# Patient Record
Sex: Male | Born: 1975 | Race: White | Hispanic: No | Marital: Married | Smoking: Never smoker
Health system: Southern US, Community
[De-identification: ages and names within clinical notes are randomized; demographics above are authoritative.]

## PROBLEM LIST (undated history)

## (undated) DIAGNOSIS — K921 Melena: Secondary | ICD-10-CM

## (undated) DIAGNOSIS — Z973 Presence of spectacles and contact lenses: Secondary | ICD-10-CM

## (undated) DIAGNOSIS — K219 Gastro-esophageal reflux disease without esophagitis: Secondary | ICD-10-CM

## (undated) DIAGNOSIS — F419 Anxiety disorder, unspecified: Secondary | ICD-10-CM

## (undated) DIAGNOSIS — F32A Depression, unspecified: Secondary | ICD-10-CM

## (undated) HISTORY — DX: Depression, unspecified: F32.A

## (undated) HISTORY — PX: SHOULDER ARTHROSCOPY: SHX128

## (undated) HISTORY — DX: Melena: K92.1

## (undated) HISTORY — DX: Anxiety disorder, unspecified: F41.9

## (undated) HISTORY — PX: ROTATOR CUFF REPAIR: SHX139

---

## 2020-03-02 DIAGNOSIS — K219 Gastro-esophageal reflux disease without esophagitis: Secondary | ICD-10-CM | POA: Insufficient documentation

## 2021-05-26 DIAGNOSIS — N281 Cyst of kidney, acquired: Secondary | ICD-10-CM | POA: Diagnosis not present

## 2021-05-26 DIAGNOSIS — N200 Calculus of kidney: Secondary | ICD-10-CM | POA: Diagnosis not present

## 2021-06-09 DIAGNOSIS — N281 Cyst of kidney, acquired: Secondary | ICD-10-CM | POA: Diagnosis not present

## 2021-06-09 DIAGNOSIS — N200 Calculus of kidney: Secondary | ICD-10-CM | POA: Diagnosis not present

## 2021-06-12 ENCOUNTER — Other Ambulatory Visit: Payer: Self-pay

## 2021-06-13 ENCOUNTER — Other Ambulatory Visit: Payer: Self-pay

## 2021-06-13 ENCOUNTER — Encounter: Payer: Self-pay | Admitting: Gastroenterology

## 2021-06-13 ENCOUNTER — Ambulatory Visit: Payer: BC Managed Care – PPO | Admitting: Gastroenterology

## 2021-06-13 VITALS — BP 138/81 | HR 72 | Temp 98.4°F | Ht 74.0 in | Wt 242.4 lb

## 2021-06-13 DIAGNOSIS — K224 Dyskinesia of esophagus: Secondary | ICD-10-CM

## 2021-06-13 NOTE — Progress Notes (Signed)
?  ?Shawn Repress, MD ?[address]  ?[address]  ?[place], [place] [address]  ?Main: [phone]  ?Fax: [phone] ? ? ? ?Gastroenterology Consultation ? ?Referring Provider:     No ref. provider found ?Primary Care Physician:  Pcp, No ?Primary Gastroenterologist:  Dr. Arlyss Melendez ?Reason for Consultation:     Esophageal spasm/atypical chest pain ?      ? HPI:   ?Shawn Melendez is a 46 y.o. male referred by Pcp, No  for consultation & management of esophageal spasm.  Patient reports that for the last 1 year, he has been experiencing episodes of what he felt like an esophageal spasm.  He was originally evaluated for any cardiac etiology and it was unremarkable.  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  He was started on omeprazole 40 mg once daily which has been taking.  He did notice that his symptoms are less frequent and milder.  He used to experience food getting stuck especially dry [place] this was in the past.  He denies any food allergies or seasonal allergies ? 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  He moved from [place].  He lives with his wife and 4 kids ? ?NSAIDs: None ? ?Antiplts/Anticoagulants/Anti thrombotics: None ? ?GI Procedures: None ? ?History reviewed. No pertinent past medical history. ? ?History reviewed. No pertinent surgical history. ? ? ?Current Outpatient Medications:  ?  Omeprazole 20 MG TBDD, 40 mg., Disp: , Rfl:  ? ?History reviewed. No pertinent family history.  ? ?Social History  ? ?Tobacco Use  ? Smoking status: Never  ? Smokeless tobacco: Never  ?Substance Use Topics  ? Alcohol use: Yes  ?  Comment: 3-4 times a week  ? Drug use: Never  ? ? ?Allergies as of 06/13/2021  ? (No Known Allergies)  ? ? ?Review of Systems:    ?All systems reviewed and negative except where noted in HPI. ? ? Physical Exam:  ?BP 138/81 (BP  Location: Left Arm, Patient Position: Sitting, Cuff Size: Normal)   Pulse 72   Temp 98.4 ?F (36.9 ?C) (Oral)   Ht 6\' 2"  (1.88 m)   Wt 242 lb 6 oz (109.9 kg)   BMI 31.12 kg/m?  ?No LMP for male patient. ? ?General:   Alert,  Well-developed, well-nourished, pleasant and cooperative in NAD ?Head:  Normocephalic and atraumatic. ?Eyes:  Sclera clear, no icterus.   Conjunctiva pink. ?Ears:  Normal auditory acuity. ?Nose:  No deformity, discharge, or lesions. ?Mouth:  No deformity or lesions,oropharynx pink & moist. ?Neck:  Supple; no masses or thyromegaly. ?Lungs:  Respirations even and unlabored.  Clear throughout to auscultation.   No wheezes, crackles, or rhonchi. No acute distress. ?Heart:  Regular rate and rhythm; no murmurs, clicks, rubs, or gallops. ?Abdomen:  Normal bowel sounds. Soft, non-tender and non-distended without masses, hepatosplenomegaly or hernias noted.  No guarding or rebound tenderness.   ?Rectal: Not performed ?Msk:  Symmetrical without gross deformities. Good, equal movement & strength bilaterally. ?Pulses:  Normal pulses noted. ?Extremities:  No clubbing or edema.  No cyanosis. ?Neurologic:  Alert and oriented x3;  grossly normal neurologically. ?Skin:  Intact without significant lesions or rashes. No jaundice. ?Psych:  Alert and cooperative. Normal mood and affect. ? ?Imaging Studies: ?Reviewed ? ?Assessment and Plan:  ? ?Shawn Melendez is a 46 y.o.  pleasant Caucasian male with no significant past medical history seen in consultation for 1 year history of atypical chest pain.  Cardiac etiology is ruled out.  Barium esophagogram revealed cricopharyngeal prominence only.  Currently on omeprazole 40 mg daily ? ?Recommend EGD with proximal and distal esophageal biopsies to evaluate for EOE ?Continue omeprazole for now ? ?Follow up based on the EGD results ? ? ?Shawn Repress, MD ? ?

## 2021-06-15 ENCOUNTER — Encounter: Payer: Self-pay | Admitting: Gastroenterology

## 2021-06-19 DIAGNOSIS — R202 Paresthesia of skin: Secondary | ICD-10-CM | POA: Insufficient documentation

## 2021-06-27 ENCOUNTER — Other Ambulatory Visit: Payer: Self-pay

## 2021-06-27 ENCOUNTER — Encounter
Admission: RE | Disposition: A | Discharge status: Home / Self Care | Payer: Self-pay | Source: Home / Self Care | Attending: Gastroenterology

## 2021-06-27 ENCOUNTER — Ambulatory Visit
Admission: RE | Admit: 2021-06-27 | Discharge: 2021-06-27 | Disposition: A | Discharge status: Home / Self Care | Payer: BC Managed Care – PPO | Attending: Gastroenterology | Admitting: Gastroenterology

## 2021-06-27 ENCOUNTER — Ambulatory Visit: Payer: BC Managed Care – PPO | Admitting: Anesthesiology

## 2021-06-27 DIAGNOSIS — K3189 Other diseases of stomach and duodenum: Secondary | ICD-10-CM | POA: Diagnosis not present

## 2021-06-27 DIAGNOSIS — K219 Gastro-esophageal reflux disease without esophagitis: Secondary | ICD-10-CM | POA: Insufficient documentation

## 2021-06-27 DIAGNOSIS — K319 Disease of stomach and duodenum, unspecified: Secondary | ICD-10-CM | POA: Diagnosis not present

## 2021-06-27 DIAGNOSIS — K224 Dyskinesia of esophagus: Secondary | ICD-10-CM | POA: Diagnosis not present

## 2021-06-27 DIAGNOSIS — R0789 Other chest pain: Secondary | ICD-10-CM | POA: Diagnosis not present

## 2021-06-27 HISTORY — DX: Presence of spectacles and contact lenses: Z97.3

## 2021-06-27 HISTORY — DX: Gastro-esophageal reflux disease without esophagitis: K21.9

## 2021-06-27 HISTORY — PX: ESOPHAGOGASTRODUODENOSCOPY (EGD) WITH PROPOFOL: SHX5813

## 2021-06-27 SURGERY — ESOPHAGOGASTRODUODENOSCOPY (EGD) WITH PROPOFOL
Anesthesia: Monitor Anesthesia Care | Site: Esophagus

## 2021-06-27 MED ORDER — ACETAMINOPHEN 160 MG/5ML PO SOLN: 325.0000 mg | ORAL | Status: DC | PRN

## 2021-06-27 MED ORDER — SODIUM CHLORIDE 0.9 % IV SOLN: INTRAVENOUS | Status: DC

## 2021-06-27 MED ORDER — LIDOCAINE HCL (CARDIAC) PF 100 MG/5ML IV SOSY
PREFILLED_SYRINGE | INTRAVENOUS | Status: DC | PRN
  Administered 2021-06-27: 100 mg via INTRAVENOUS

## 2021-06-27 MED ORDER — PROPOFOL 10 MG/ML IV BOLUS
INTRAVENOUS | Status: DC | PRN
  Administered 2021-06-27 (×2): 100 mg via INTRAVENOUS

## 2021-06-27 MED ORDER — LACTATED RINGERS IV SOLN: INTRAVENOUS | Status: DC

## 2021-06-27 MED ORDER — GLYCOPYRROLATE 0.2 MG/ML IJ SOLN
INTRAMUSCULAR | Status: DC | PRN
Start: 2021-06-27 — End: 2021-06-27
  Administered 2021-06-27: .2 mg via INTRAVENOUS

## 2021-06-27 MED ORDER — STERILE WATER FOR IRRIGATION IR SOLN
Status: DC | PRN
  Administered 2021-06-27: 1

## 2021-06-27 MED ORDER — ACETAMINOPHEN 325 MG PO TABS: 325.0000 mg | ORAL_TABLET | ORAL | Status: DC | PRN

## 2021-06-27 SURGICAL SUPPLY — 34 items
BALLN DILATOR 10-12 8 (BALLOONS)
BALLN DILATOR 12-15 8 (BALLOONS)
BALLN DILATOR 15-18 8 (BALLOONS)
BALLN DILATOR CRE 0-12 8 (BALLOONS)
BALLN DILATOR ESOPH 8 10 CRE (MISCELLANEOUS) IMPLANT
BALLOON DILATOR 12-15 8 (BALLOONS) IMPLANT
BALLOON DILATOR 15-18 8 (BALLOONS) IMPLANT
BALLOON DILATOR CRE 0-12 8 (BALLOONS) IMPLANT
BLOCK BITE 60FR ADLT L/F GRN (MISCELLANEOUS) ×2 IMPLANT
CLIP HMST 235XBRD CATH ROT (MISCELLANEOUS) IMPLANT
CLIP RESOLUTION 360 11X235 (MISCELLANEOUS)
ELECT REM PT RETURN 9FT ADLT (ELECTROSURGICAL)
ELECTRODE REM PT RTRN 9FT ADLT (ELECTROSURGICAL) IMPLANT
FCP ESCP3.2XJMB 240X2.8X (MISCELLANEOUS) ×1
FORCEPS BIOP RAD 4 LRG CAP 4 (CUTTING FORCEPS) IMPLANT
FORCEPS BIOP RJ4 240 W/NDL (MISCELLANEOUS) ×2
FORCEPS ESCP3.2XJMB 240X2.8X (MISCELLANEOUS) IMPLANT
GOWN CVR UNV OPN BCK APRN NK (MISCELLANEOUS) ×2 IMPLANT
GOWN ISOL THUMB LOOP REG UNIV (MISCELLANEOUS) ×4
INJECTOR VARIJECT VIN23 (MISCELLANEOUS) IMPLANT
KIT DEFENDO VALVE AND CONN (KITS) IMPLANT
KIT PRC NS LF DISP ENDO (KITS) ×1 IMPLANT
KIT PROCEDURE OLYMPUS (KITS) ×2
MANIFOLD NEPTUNE II (INSTRUMENTS) ×2 IMPLANT
MARKER SPOT ENDO TATTOO 5ML (MISCELLANEOUS) IMPLANT
RETRIEVER NET PLAT FOOD (MISCELLANEOUS) IMPLANT
SNARE SHORT THROW 13M SML OVAL (MISCELLANEOUS) IMPLANT
SNARE SHORT THROW 30M LRG OVAL (MISCELLANEOUS) IMPLANT
SPOT EX ENDOSCOPIC TATTOO (MISCELLANEOUS)
SYR INFLATION 60ML (SYRINGE) IMPLANT
TRAP ETRAP POLY (MISCELLANEOUS) IMPLANT
VARIJECT INJECTOR VIN23 (MISCELLANEOUS)
WATER STERILE IRR 250ML POUR (IV SOLUTION) ×2 IMPLANT
WIRE CRE 18-20MM 8CM F G (MISCELLANEOUS) IMPLANT

## 2021-06-27 NOTE — Transfer of Care (Signed)
Immediate Anesthesia Transfer of Care Note ? ?Patient: Shawn Melendez ? ?Procedure(s) Performed: ESOPHAGOGASTRODUODENOSCOPY (EGD) WITH PROPOFOL (Esophagus) ? ?Patient Location: PACU ? ?Anesthesia Type: MAC ? ?Level of Consciousness: awake, alert  and patient cooperative ? ?Airway and Oxygen Therapy: Patient Spontanous Breathing and Patient connected to supplemental oxygen ? ?Post-op Assessment: Post-op Vital signs reviewed, Patient's Cardiovascular Status Stable, Respiratory Function Stable, Patent Airway and No signs of Nausea or vomiting ? ?Post-op Vital Signs: Reviewed and stable ? ?Complications: No notable events documented. ? ?

## 2021-06-27 NOTE — Anesthesia Preprocedure Evaluation (Addendum)
Anesthesia Evaluation  ?Patient identified by MRN, date of birth, ID band ?Patient awake ? ? ? ?Reviewed: ?Allergy & Precautions, H&P , NPO status , Patient's Chart, lab work & pertinent test results, reviewed documented beta blocker date and time  ? ?Airway ?Mallampati: II ? ?TM Distance: >3 FB ?Neck ROM: full ? ? ? Dental ?no notable dental hx. ? ?  ?Pulmonary ?neg pulmonary ROS,  ?  ?Pulmonary exam normal ?breath sounds clear to auscultation ? ? ? ? ? ? Cardiovascular ?Exercise Tolerance: Good ?negative cardio ROS ?Normal cardiovascular exam ?Rhythm:regular Rate:Normal ? ? ?  ?Neuro/Psych ?negative neurological ROS ? negative psych ROS  ? GI/Hepatic ?Neg liver ROS, GERD  ,  ?Endo/Other  ?negative endocrine ROS ? Renal/GU ?negative Renal ROS  ?negative genitourinary ?  ?Musculoskeletal ? ? Abdominal ?  ?Peds ? Hematology ?negative hematology ROS ?(+)   ?Anesthesia Other Findings ? ? Reproductive/Obstetrics ?negative OB ROS ? ?  ? ? ? ? ? ? ? ? ? ? ? ? ? ?  ?  ? ? ? ? ? ? ? ? ?Anesthesia Physical ?Anesthesia Plan ? ?ASA: 2 ? ?Anesthesia Plan: General  ? ?Post-op Pain Management:   ? ?Induction:  ? ?PONV Risk Score and Plan:  ? ?Airway Management Planned:  ? ?Additional Equipment:  ? ?Intra-op Plan:  ? ?Post-operative Plan:  ? ?Informed Consent: I have reviewed the patients History and Physical, chart, labs and discussed the procedure including the risks, benefits and alternatives for the proposed anesthesia with the patient or authorized representative who has indicated his/her understanding and acceptance.  ? ? ? ?Dental Advisory Given ? ?Plan Discussed with: CRNA and Anesthesiologist ? ?Anesthesia Plan Comments:   ? ? ? ? ? ?Anesthesia Quick Evaluation ? ?

## 2021-06-27 NOTE — Anesthesia Postprocedure Evaluation (Signed)
Anesthesia Post Note ? ?Patient: Shawn Melendez ? ?Procedure(s) Performed: ESOPHAGOGASTRODUODENOSCOPY (EGD) WITH PROPOFOL (Esophagus) ? ? ?  ?Patient location during evaluation: PACU ?Anesthesia Type: General ?Level of consciousness: awake and alert ?Pain management: pain level controlled ?Vital Signs Assessment: post-procedure vital signs reviewed and stable ?Respiratory status: spontaneous breathing, nonlabored ventilation, respiratory function stable and patient connected to nasal cannula oxygen ?Cardiovascular status: blood pressure returned to baseline and stable ?Postop Assessment: no apparent nausea or vomiting ?Anesthetic complications: no ? ? ?No notable events documented. ? ?Trecia Rogers ? ? ? ? ? ?

## 2021-06-27 NOTE — Op Note (Signed)
[facility] ?Gastroenterology ?Patient Name: Shawn Melendez ?Procedure Date: 06/27/2021 8:28 AM ?MRN: [id number] ?Account #: [id number] ?Date of Birth: 10/07/1975 ?Admit Type: Outpatient ?Age: 46 ?Room: [facility] OR ROOM 01 ?Gender: Male ?Note Status: Finalized ?Instrument Name: [id number] ?Procedure:             Upper GI endoscopy ?Indications:           Chest pain (non cardiac) ?Providers:             Lin Landsman MD, MD ?Medicines:             General Anesthesia ?Complications:         No immediate complications. Estimated blood loss: None. ?Procedure:             Pre-Anesthesia Assessment: ?                       - Prior to the procedure, a History and Physical was  ?                       performed, and patient medications and allergies were  ?                       reviewed. The patient is competent. The risks and  ?                       benefits of the procedure and the sedation options and  ?                       risks were discussed with the patient. All questions  ?                       were answered and informed consent was obtained.  ?                       Patient identification and proposed procedure were  ?                       verified by the physician, the nurse, the  ?                       anesthesiologist, the anesthetist and the technician  ?                       in the pre-procedure area in the procedure room in the  ?                       endoscopy suite. Mental Status Examination: alert and  ?                       oriented. Airway Examination: normal oropharyngeal  ?                       airway and neck mobility. Respiratory Examination:  ?                       clear to auscultation. CV Examination: normal.  ?                       Prophylactic Antibiotics: The  patient does not require  ?                       prophylactic antibiotics. Prior Anticoagulants: The  ?                       patient has taken no previous anticoagulant or  ?                       antiplatelet  agents. ASA Grade Assessment: II - A  ?                       patient with mild systemic disease. After reviewing  ?                       the risks and benefits, the patient was deemed in  ?                       satisfactory condition to undergo the procedure. The  ?                       anesthesia plan was to use general anesthesia.  ?                       Immediately prior to administration of medications,  ?                       the patient was re-assessed for adequacy to receive  ?                       sedatives. The heart rate, respiratory rate, oxygen  ?                       saturations, blood pressure, adequacy of pulmonary  ?                       ventilation, and response to care were monitored  ?                       throughout the procedure. The physical status of the  ?                       patient was re-assessed after the procedure. ?                       After obtaining informed consent, the endoscope was  ?                       passed under direct vision. Throughout the procedure,  ?                       the patient's blood pressure, pulse, and oxygen  ?                       saturations were monitored continuously. The was  ?                       introduced through the mouth, and advanced to the  ?  second part of duodenum. The upper GI endoscopy was  ?                       accomplished without difficulty. The patient tolerated  ?                       the procedure well. ?Findings: ?     The duodenal bulb and second portion of the duodenum were normal. ?     Diffuse mildly erythematous mucosa without bleeding was found in the  ?     gastric body and in the gastric antrum. Biopsies were taken with a cold  ?     forceps for histology. ?     Esophagogastric landmarks were identified: the gastroesophageal junction  ?     was found at 43 cm from the incisors. ?     The gastroesophageal junction and examined esophagus were normal.  ?     Biopsies were obtained from the  proximal and distal esophagus with cold  ?     forceps for histology of suspected eosinophilic esophagitis. ?Impression:            - Normal duodenal bulb and second portion of the  ?                       duodenum. ?                       - Erythematous mucosa in the gastric body and antrum.  ?                       Biopsied. ?                       - Esophagogastric landmarks identified. ?                       - Normal gastroesophageal junction and esophagus.  ?                       Biopsied. ?Recommendation:        - Discharge patient to home (with escort). ?                       - Resume previous diet today. ?                       - Continue present medications. ?                       - Await pathology results. ?Procedure Code(s):     --- Professional --- ?                       [phone], Esophagogastroduodenoscopy, flexible,  ?                       transoral; with biopsy, single or multiple ?Diagnosis Code(s):     --- Professional --- ?                       K31.89, Other diseases of stomach and duodenum ?  R07.89, Other chest pain ?CPT copyright 2019 American Medical Association. All rights reserved. ?The codes documented in this report are preliminary and upon coder review may  ?be revised to meet current compliance requirements. ?Dr. Ulyess Mort ?Maresa Morash Raeanne Gathers MD, MD ?06/27/2021 8:43:46 AM ?This report has been signed electronically. ?Number of Addenda: 0 ?Note Initiated On: 06/27/2021 8:28 AM ?Total Procedure Duration: 0 hours 6 minutes 55 seconds  ?Estimated Blood Loss:  Estimated blood loss: none. ?     [facility] ?

## 2021-06-27 NOTE — H&P (Signed)
?  Arlyss Repress, MD ?[address]  ?[address]  ?[place], [place] [address]  ?Main: [phone]  ?Fax: [phone] ?Pager: [phone] ? ?Primary Care Physician:  Pcp, No ?Primary Gastroenterologist:  Dr. Arlyss Repress ? ?Pre-Procedure History & Physical: ?HPI:  Claire Bridge is a 46 y.o. male is here for an endoscopy. ?  ?Past Medical History:  ?Diagnosis Date  ? GERD (gastroesophageal reflux disease)   ? Wears contact lenses   ? ? ?Past Surgical History:  ?Procedure Laterality Date  ? SHOULDER ARTHROSCOPY Right   ? ? ?Prior to Admission medications   ?Medication Sig Start Date End Date Taking? Authorizing Provider  ?Omeprazole 20 MG TBDD 40 mg. 07/01/20   [provider]  ? ? ?Allergies as of 06/13/2021  ? (No Known Allergies)  ? ? ?History reviewed. No pertinent family history. ? ?Social History  ? ?Socioeconomic History  ? Marital status: Married  ?  Spouse name: Not on file  ? Number of children: Not on file  ? Years of education: Not on file  ? Highest education level: Not on file  ?Occupational History  ? Not on file  ?Tobacco Use  ? Smoking status: Never  ? Smokeless tobacco: Never  ?Substance and Sexual Activity  ? Alcohol use: Yes  ?  Comment: 3-4 times a week  ? Drug use: Never  ? Sexual activity: Not on file  ?Other Topics Concern  ? Not on file  ?Social History Narrative  ? Not on file  ? ?Social Determinants of Health  ? ?Financial Resource Strain: Not on file  ?Food Insecurity: Not on file  ?Transportation Needs: Not on file  ?Physical Activity: Not on file  ?Stress: Not on file  ?Social Connections: Not on file  ?Intimate Partner Violence: Not on file  ? ? ?Review of Systems: ?See HPI, otherwise negative ROS ? ?Physical Exam: ?BP 133/80   Pulse 67   Temp 98 ?F (36.7 ?C) (Temporal)   Resp 18   Ht 6\' 2"  (1.88 m)   Wt 108.9 kg   SpO2 98%   BMI 30.81 kg/m?  ?General:   Alert,  pleasant and cooperative in NAD ?Head:  Normocephalic and atraumatic. ?Neck:  Supple; no masses or  thyromegaly. ?Lungs:  Clear throughout to auscultation.    ?Heart:  Regular rate and rhythm. ?Abdomen:  Soft, nontender and nondistended. Normal bowel sounds, without guarding, and without rebound.   ?Neurologic:  Alert and  oriented x4;  grossly normal neurologically. ? ?Impression/Plan: ?Khyree Carillo is here for an endoscopy to be performed for esophageal spasm/atypical chest pain ? ?Risks, benefits, limitations, and alternatives regarding  endoscopy have been reviewed with the patient.  Questions have been answered.  All parties agreeable. ? ? ?Veto Kemps, MD  06/27/2021, 8:29 AM ?

## 2021-06-27 NOTE — Anesthesia Procedure Notes (Addendum)
Procedure Name: [place] ?Date/Time: 06/27/2021 8:35 AM ?Performed by: Jeannene Patella, CRNA ?Pre-anesthesia Checklist: Patient identified, Emergency Drugs available, Suction available, Timeout performed and Patient being monitored ?Patient Re-evaluated:Patient Re-evaluated prior to induction ?Oxygen Delivery Method: Nasal cannula ?Placement Confirmation: positive ETCO2 ? ? ? ? ?

## 2021-06-28 ENCOUNTER — Encounter: Payer: Self-pay | Admitting: Gastroenterology

## 2021-06-28 ENCOUNTER — Telehealth: Payer: Self-pay

## 2021-06-28 LAB — SURGICAL PATHOLOGY

## 2021-06-28 NOTE — Telephone Encounter (Signed)
-----   Message from Lin Landsman, MD sent at 06/28/2021 12:47 PM EDT ----- ?Caryl Pina ? ?Please inform patient that the pathology results from upper endoscopy came back all normal.  Next step would be to evaluate for any abnormality of the motility of his esophagus by doing a motility study called esophageal manometry.  If he agrees, please refer him to [facility] ? ?Rohini Vanga ?

## 2021-06-28 NOTE — Telephone Encounter (Signed)
Called and left a message for call back  

## 2021-06-29 NOTE — Telephone Encounter (Signed)
Patient verbalized of results he states he is going to research this test and if he decides to do it he will let us know   ?

## 2021-07-31 ENCOUNTER — Ambulatory Visit: Payer: BC Managed Care – PPO | Admitting: Registered Nurse

## 2021-07-31 ENCOUNTER — Encounter: Payer: Self-pay | Admitting: Registered Nurse

## 2021-07-31 ENCOUNTER — Encounter: Payer: Self-pay | Admitting: Neurology

## 2021-07-31 ENCOUNTER — Other Ambulatory Visit: Payer: Self-pay

## 2021-07-31 VITALS — BP 136/82 | HR 69 | Temp 98.1°F | Resp 18 | Ht 74.0 in | Wt 242.2 lb

## 2021-07-31 DIAGNOSIS — R200 Anesthesia of skin: Secondary | ICD-10-CM

## 2021-07-31 DIAGNOSIS — G1220 Motor neuron disease, unspecified: Secondary | ICD-10-CM

## 2021-07-31 DIAGNOSIS — R531 Weakness: Secondary | ICD-10-CM | POA: Diagnosis not present

## 2021-07-31 DIAGNOSIS — G629 Polyneuropathy, unspecified: Secondary | ICD-10-CM | POA: Diagnosis not present

## 2021-07-31 DIAGNOSIS — Z820 Family history of epilepsy and other diseases of the nervous system: Secondary | ICD-10-CM | POA: Diagnosis not present

## 2021-07-31 LAB — COMPREHENSIVE METABOLIC PANEL
ALT: 88 U/L — ABNORMAL HIGH (ref 0–53)
AST: 78 U/L — ABNORMAL HIGH (ref 0–37)
Albumin: 4.7 g/dL (ref 3.5–5.2)
Alkaline Phosphatase: 62 U/L (ref 39–117)
BUN: 14 mg/dL (ref 6–23)
CO2: 26 mEq/L (ref 19–32)
Calcium: 9.1 mg/dL (ref 8.4–10.5)
Chloride: 100 mEq/L (ref 96–112)
Creatinine, Ser: 1.06 mg/dL (ref 0.40–1.50)
GFR: 85.62 mL/min (ref >60.00)
Glucose, Bld: 87 mg/dL (ref 70–99)
Potassium: 4 mEq/L (ref 3.5–5.1)
Sodium: 136 mEq/L (ref 135–145)
Total Bilirubin: 0.9 mg/dL (ref 0.2–1.2)
Total Protein: 7.7 g/dL (ref 6.0–8.3)

## 2021-07-31 LAB — LIPID PANEL
Cholesterol: 230 mg/dL — ABNORMAL HIGH (ref 0–200)
HDL: 44.8 mg/dL (ref >39.00)
LDL Cholesterol: 166 mg/dL — ABNORMAL HIGH (ref 0–99)
NonHDL: 185.23
Total CHOL/HDL Ratio: 5
Triglycerides: 97 mg/dL (ref 0.0–149.0)
VLDL: 19.4 mg/dL (ref 0.0–40.0)

## 2021-07-31 LAB — URINALYSIS, ROUTINE W REFLEX MICROSCOPIC
Bilirubin Urine: NEGATIVE
Hgb urine dipstick: NEGATIVE
Ketones, ur: NEGATIVE
Leukocytes,Ua: NEGATIVE
Nitrite: NEGATIVE
RBC / HPF: NONE SEEN (ref 0–?)
Specific Gravity, Urine: 1.015 (ref 1.000–1.030)
Total Protein, Urine: NEGATIVE
Urine Glucose: NEGATIVE
Urobilinogen, UA: 0.2 (ref 0.0–1.0)
pH: 6.5 (ref 5.0–8.0)

## 2021-07-31 LAB — CBC WITH DIFFERENTIAL/PLATELET
Basophils Absolute: 0.1 10*3/uL (ref 0.0–0.1)
Basophils Relative: 0.8 % (ref 0.0–3.0)
Eosinophils Absolute: 0.2 10*3/uL (ref 0.0–0.7)
Eosinophils Relative: 2.4 % (ref 0.0–5.0)
HCT: 42.3 % (ref 39.0–52.0)
Hemoglobin: 14.3 g/dL (ref 13.0–17.0)
Lymphocytes Relative: 29.8 % (ref 12.0–46.0)
Lymphs Abs: 2 10*3/uL (ref 0.7–4.0)
MCHC: 33.8 g/dL (ref 30.0–36.0)
MCV: 93.6 fl (ref 78.0–100.0)
Monocytes Absolute: 0.6 10*3/uL (ref 0.1–1.0)
Monocytes Relative: 8.5 % (ref 3.0–12.0)
Neutro Abs: 3.9 10*3/uL (ref 1.4–7.7)
Neutrophils Relative %: 58.5 % (ref 43.0–77.0)
Platelets: 246 10*3/uL (ref 150.0–400.0)
RBC: 4.52 Mil/uL (ref 4.22–5.81)
RDW: 13.2 % (ref 11.5–15.5)
WBC: 6.7 10*3/uL (ref 4.0–10.5)

## 2021-07-31 LAB — B12 AND FOLATE PANEL
Folate: >23.5 ng/mL (ref >5.9)
Vitamin B-12: 292 pg/mL (ref 211–911)

## 2021-07-31 LAB — C-REACTIVE PROTEIN: CRP: <1 mg/dL (ref 0.5–20.0)

## 2021-07-31 LAB — VITAMIN D 25 HYDROXY (VIT D DEFICIENCY, FRACTURES): VITD: 29.48 ng/mL — ABNORMAL LOW (ref 30.00–100.00)

## 2021-07-31 LAB — HEMOGLOBIN A1C: Hgb A1c MFr Bld: 5.3 % (ref 4.6–6.5)

## 2021-07-31 LAB — SEDIMENTATION RATE: Sed Rate: 17 mm/hr — ABNORMAL HIGH (ref 0–15)

## 2021-07-31 LAB — TSH: TSH: 4.13 u[IU]/mL (ref 0.35–5.50)

## 2021-07-31 NOTE — Progress Notes (Signed)
? ?New Patient Office Visit ? ?Subjective:  ?Patient ID: Shawn Melendez, male    DOB: 17-Dec-1975  Age: 46 y.o. MRN: [id number] ? ?CC:  ?Chief Complaint  ?Patient presents with  ? New Patient (Initial Visit)  ?  Patient states he is here to establish care. Patient states he has been feeling numbness and tingling in face , lower back and leg for 5-6 weeks   ? ? ?HPI ?Shawn Melendez presents to establish care ? ?Numbness: ?Face, lower back, leg. Superficial numbness.  ?Ongoing 5-6 weeks ?Has had this in the past, but never for this duration. ?Occ trouble focusing vision, but generally 20/20 with corrective lenses. ?Occ "zaps" in legs. Some weakness in legs - feeling heavy.  ? ?Admits he is not as active as he had been in the past.  ? ?Outpatient Encounter Medications as of 07/31/2021  ?Medication Sig  ? Omeprazole 20 MG TBDD 40 mg.  ? ?No facility-administered encounter medications on file as of 07/31/2021.  ? ? ?Past Medical History:  ?Diagnosis Date  ? Anxiety   ? Depression   ? GERD (gastroesophageal reflux disease)   ? Wears contact lenses   ? ? ?Past Surgical History:  ?Procedure Laterality Date  ? ESOPHAGOGASTRODUODENOSCOPY (EGD) WITH PROPOFOL N/A 06/27/2021  ? Procedure: ESOPHAGOGASTRODUODENOSCOPY (EGD) WITH PROPOFOL;  Surgeon: Toney Reil, MD;  [facility];  Service: Endoscopy;  Laterality: N/A;  ? SHOULDER ARTHROSCOPY Right   ? ? ?Family History  ?Problem Relation Age of Onset  ? Hypertension Mother   ? Diabetes Father   ? Hypertension Father   ? Stroke Father   ? Multiple sclerosis Father   ? Diabetes Brother   ? ? ?Social History  ? ?Socioeconomic History  ? Marital status: Married  ?  Spouse name: Not on file  ? Number of children: 4  ? Years of education: Not on file  ? Highest education level: Not on file  ?Occupational History  ? Not on file  ?Tobacco Use  ? Smoking status: Never  ? Smokeless tobacco: Never  ?Vaping Use  ? Vaping Use: Never used  ?Substance and Sexual Activity  ?  Alcohol use: Yes  ?  Alcohol/week: 10.0 standard drinks  ?  Types: 10 Shots of liquor per week  ?  Comment: 3-4 times a week  ? Drug use: Never  ? Sexual activity: Not Currently  ?Other Topics Concern  ? Not on file  ?Social History Narrative  ? Not on file  ? ?Social Determinants of Health  ? ?Financial Resource Strain: Not on file  ?Food Insecurity: Not on file  ?Transportation Needs: Not on file  ?Physical Activity: Not on file  ?Stress: Not on file  ?Social Connections: Not on file  ?Intimate Partner Violence: Not on file  ? ? ?ROS ?Review of Systems  ?Constitutional: Negative.   ?HENT: Negative.    ?Eyes: Negative.   ?Respiratory: Negative.    ?Cardiovascular: Negative.   ?Gastrointestinal: Negative.   ?Genitourinary: Negative.   ?Musculoskeletal: Negative.   ?Skin: Negative.   ?Neurological: Negative.   ?Psychiatric/Behavioral: Negative.    ?All other systems reviewed and are negative. ? ?Objective:  ? ?Today's Vitals: BP 136/82   Pulse 69   Temp 98.1 ?F (36.7 ?C) (Temporal)   Resp 18   Ht 6\' 2"  (1.88 m)   Wt 242 lb 3.2 oz (109.9 kg)   SpO2 98%   BMI 31.10 kg/m?  ? ?Physical Exam ?Constitutional:   ?   General:  He is not in acute distress. ?   Appearance: Normal appearance. He is normal weight. He is not ill-appearing, toxic-appearing or diaphoretic.  ?Cardiovascular:  ?   Rate and Rhythm: Normal rate and regular rhythm.  ?   Heart sounds: Normal heart sounds. No murmur heard. ?  No friction rub. No gallop.  ?Pulmonary:  ?   Effort: Pulmonary effort is normal. No respiratory distress.  ?   Breath sounds: Normal breath sounds. No stridor. No wheezing, rhonchi or rales.  ?Chest:  ?   Chest wall: No tenderness.  ?Skin: ?   General: Skin is warm and dry.  ?Neurological:  ?   General: No focal deficit present.  ?   Mental Status: He is alert and oriented to person, place, and time. Mental status is at baseline.  ?   Cranial Nerves: No cranial nerve deficit.  ?   Sensory: No sensory deficit.  ?   Motor: No  weakness.  ?   Coordination: Coordination normal.  ?   Gait: Gait normal.  ?Psychiatric:     ?   Mood and Affect: Mood normal.     ?   Behavior: Behavior normal.     ?   Thought Content: Thought content normal.     ?   Judgment: Judgment normal.  ? ? ? ? ? ? ?Assessment & Plan:  ? ?Problem List Items Addressed This Visit   ?None ?Visit Diagnoses   ? ? Neuropathy    -  Primary  ? Relevant Orders  ? Comprehensive metabolic panel  ? Hemoglobin A1c  ? CBC with Differential/Platelet  ? Lipid panel  ? TSH  ? B12 and Folate Panel  ? Vitamin D (25 hydroxy)  ? Antinuclear Antib (ANA)  ? Sedimentation rate  ? C-reactive protein  ? Urinalysis, Routine w reflex microscopic  ? Ambulatory referral to Neurology  ? CT HEAD WO CONTRAST ( )  ? Weakness      ? Relevant Orders  ? Comprehensive metabolic panel  ? Hemoglobin A1c  ? CBC with Differential/Platelet  ? Lipid panel  ? TSH  ? B12 and Folate Panel  ? Vitamin D (25 hydroxy)  ? Antinuclear Antib (ANA)  ? Sedimentation rate  ? C-reactive protein  ? Urinalysis, Routine w reflex microscopic  ? Ambulatory referral to Neurology  ? CT HEAD WO CONTRAST ( )  ? Facial numbness      ? Relevant Orders  ? Comprehensive metabolic panel  ? Hemoglobin A1c  ? CBC with Differential/Platelet  ? Lipid panel  ? TSH  ? B12 and Folate Panel  ? Vitamin D (25 hydroxy)  ? Antinuclear Antib (ANA)  ? Sedimentation rate  ? C-reactive protein  ? Urinalysis, Routine w reflex microscopic  ? Ambulatory referral to Neurology  ? CT HEAD WO CONTRAST ( )  ? Family history of MS (multiple sclerosis)      ? Relevant Orders  ? Comprehensive metabolic panel  ? Hemoglobin A1c  ? CBC with Differential/Platelet  ? Lipid panel  ? TSH  ? B12 and Folate Panel  ? Vitamin D (25 hydroxy)  ? Antinuclear Antib (ANA)  ? Sedimentation rate  ? C-reactive protein  ? Urinalysis, Routine w reflex microscopic  ? Ambulatory referral to Neurology  ? CT HEAD WO CONTRAST ( )  ? ?  ? ? ?Follow-up: Return if symptoms worsen or fail to  improve.  ? ?PLAN ?Neuro exam unremarkable. ?Concern for MS given fam hx and presentation. Will order broad panel  labs and CT head. Refer to neuro. ?Discussed plan with patient who voices agreement. ?Patient encouraged to call clinic with any questions, comments, or concerns. ? ? ?Janeece Ageeichard Phil Michels, NP ?

## 2021-07-31 NOTE — Patient Instructions (Addendum)
Shawn Melendez -  ? ?Great to meet you ? ?Let's start with broad lab panel and CT of head.  ? ?CT will call to schedule.  ?Labs will be back in coming days. ? ?I have referred to neurology with anticipation that this is our next step. ? ?Thank you ? ?Rich  ? ? ? ?If you have lab work done today you will be contacted with your lab results within the next 2 weeks.  If you have not heard from [place] then please contact us. The fastest way to get your results is to register for My Chart. ? ? ?IF you received an x-ray today, you will receive an invoice from [facility]. Please contact [facility] at [phone] with questions or concerns regarding your invoice.  ? ?IF you received labwork today, you will receive an invoice from [facility]. Please contact [facility] at [phone] with questions or concerns regarding your invoice.  ? ?Our billing staff will not be able to assist you with questions regarding bills from these companies. ? ?You will be contacted with the lab results as soon as they are available. The fastest way to get your results is to activate your My Chart account. Instructions are located on the last page of this paperwork. If you have not heard from [place] regarding the results in 2 weeks, please contact this office. ?  ? ? ?

## 2021-08-01 NOTE — Addendum Note (Signed)
Addended by: Janeece Agee on: 08/01/2021 07:50 AM ? ? Modules accepted: Orders ? ?

## 2021-08-02 LAB — ANTI-NUCLEAR AB-TITER (ANA TITER)
ANA TITER: 1:80 {titer} — ABNORMAL HIGH
ANA Titer 1: 1:40 {titer} — ABNORMAL HIGH

## 2021-08-02 LAB — ANA: Anti Nuclear Antibody (ANA): POSITIVE — AB

## 2021-08-08 ENCOUNTER — Ambulatory Visit
Admission: RE | Admit: 2021-08-08 | Discharge: 2021-08-08 | Disposition: A | Discharge status: Home / Self Care | Payer: BC Managed Care – PPO | Source: Ambulatory Visit | Attending: Registered Nurse | Admitting: Registered Nurse

## 2021-08-08 DIAGNOSIS — Z820 Family history of epilepsy and other diseases of the nervous system: Secondary | ICD-10-CM

## 2021-08-08 DIAGNOSIS — R200 Anesthesia of skin: Secondary | ICD-10-CM

## 2021-08-08 DIAGNOSIS — R531 Weakness: Secondary | ICD-10-CM

## 2021-08-08 DIAGNOSIS — G629 Polyneuropathy, unspecified: Secondary | ICD-10-CM

## 2021-08-08 DIAGNOSIS — G1220 Motor neuron disease, unspecified: Secondary | ICD-10-CM

## 2021-08-08 MED ORDER — GADOBENATE DIMEGLUMINE 529 MG/ML IV SOLN
20.0000 mL | Freq: Once | INTRAVENOUS | Status: AC | PRN
  Administered 2021-08-08: 20 mL via INTRAVENOUS

## 2021-08-09 ENCOUNTER — Telehealth: Payer: Self-pay

## 2021-08-09 NOTE — Telephone Encounter (Signed)
Patient is calling in asking for a call back about lab results.  

## 2021-08-09 NOTE — Progress Notes (Signed)
Dr. Everlena Cooper -  ?I know Neuro is quite busy, but wanted to see if you wouldn't mind taking a quick look at this MRI and seeing if appt in Sept is ok or if we should explore options for him to be seen sooner with either you or another provider. ? ?Thanks! ? ?Rich

## 2021-08-10 ENCOUNTER — Other Ambulatory Visit: Payer: Self-pay | Admitting: Registered Nurse

## 2021-08-10 DIAGNOSIS — R768 Other specified abnormal immunological findings in serum: Secondary | ICD-10-CM

## 2021-08-10 NOTE — Telephone Encounter (Signed)
Have consulted with Neurology, no acute concerns at this time. No definitive findings. He should stay healthy with good diet choices and regular mild to moderate exercise. We can have him follow up with rheumatology for his positive ANA, though this was a low titer and may not have significance. ?Plan on keeping appt with Neuro and keep track of symptoms in interim. ? ?Thanks, ? ?Rich

## 2021-08-10 NOTE — Telephone Encounter (Signed)
Called patient to go over labs he was asking is there something he should be concerned about with MRI ?

## 2021-08-15 ENCOUNTER — Encounter: Payer: Self-pay | Admitting: Registered Nurse

## 2021-08-17 ENCOUNTER — Telehealth: Payer: Self-pay

## 2021-08-17 DIAGNOSIS — L578 Other skin changes due to chronic exposure to nonionizing radiation: Secondary | ICD-10-CM | POA: Diagnosis not present

## 2021-08-17 DIAGNOSIS — L821 Other seborrheic keratosis: Secondary | ICD-10-CM | POA: Diagnosis not present

## 2021-08-17 DIAGNOSIS — D1801 Hemangioma of skin and subcutaneous tissue: Secondary | ICD-10-CM | POA: Diagnosis not present

## 2021-08-17 DIAGNOSIS — L814 Other melanin hyperpigmentation: Secondary | ICD-10-CM | POA: Diagnosis not present

## 2021-08-17 NOTE — Telephone Encounter (Signed)
Patient is returning a phone call.  

## 2021-08-17 NOTE — Telephone Encounter (Signed)
Patient wants to know what to do about the sinus issues he sent a mychart message about.

## 2021-08-17 NOTE — Telephone Encounter (Signed)
Attempted to call patient about some concerns he had about his MRI. LVM to return the call.

## 2021-08-24 NOTE — Telephone Encounter (Signed)
I would recommend he continue current course. Could add daily antihistamine, astelin nasal spray, and use tylenol/ibuprofen as needed.  Thanks,  Denice Paradise

## 2021-08-24 NOTE — Telephone Encounter (Signed)
Message has been sent to the patient.

## 2021-09-18 NOTE — Progress Notes (Unsigned)
NEUROLOGY CONSULTATION NOTE  Shawn Melendez MRN: [id number] DOB: 03-02-1976  Referring provider: Janeece Agee, NP Primary care provider: Janeece Agee, NP  Reason for consult:  numbness, weakness  Assessment/Plan:   Facial numbness - unclear etiology.  I am not certain it is resolved due to the nasal spray.  Possibly resolved due to B12 supplementation.  He does not have multiple sclerosis.  MRI findings are nonspecific and unremarkable.  Neurologic exam is also unremarkable. Occasional bilateral hand numbness likely carpal tunnel syndrome  1  Recommend wearing wrist splints at night to see if hand numbness symptoms resolve at night.   2  Follow up as needed.   Subjective:  Shawn Melendez is a 46 year old male with depression and anxiety who presents for numbness and weakness.  History supplemented by referring provider's note.  About 6 to 8 weeks ago, he began noticing a very mild numbness and tingling of his face running around his eyes and maxilla bilaterally.  He started experiencing tightness in the back of his thighs when he walked or was on his feet.  No actual joint stiffness or polyarthralgia.  Sometimes if he woke up at night in bed, he will notice numbness in his hands.  He may notice an occasional electric shock in a leg or sometimes on top of his head.  No fever, rash, weakness or visual disturbance.  Blood work revealed positive ANA with low level titer of 1:40-1:80, sed rate 17, CRP <1.0, B12 292, folate >23.5, TSH 4.13, Hgb 5.3, vit D 29.48.  He started taking a multivitamin with B12 and D.  MRI of brain with and without contrast on 08/08/2021 personally reviewed showed two small nonspecific T2/FLAIR hyperintense foci within the left frontal and right parietal lobes, otherwise unremarkable except for a retention cyst in the left maxillary sinus.  His father has multiple sclerosis.  He started using a nasal decongestant and facial numbness has resolved.  Due to the positive  ANA, he has upcoming appointment with rheumatology.       PAST MEDICAL HISTORY: Past Medical History:  Diagnosis Date   Anxiety    Depression    GERD (gastroesophageal reflux disease)    Wears contact lenses     PAST SURGICAL HISTORY: Past Surgical History:  Procedure Laterality Date   ESOPHAGOGASTRODUODENOSCOPY (EGD) WITH PROPOFOL N/A 06/27/2021   Procedure: ESOPHAGOGASTRODUODENOSCOPY (EGD) WITH PROPOFOL;  Surgeon: Toney Reil, MD;  Location: [facility];  Service: Endoscopy;  Laterality: N/A;   SHOULDER ARTHROSCOPY Right     MEDICATIONS: Current Outpatient Medications on File Prior to Visit  Medication Sig Dispense Refill   Omeprazole 20 MG TBDD 40 mg.     No current facility-administered medications on file prior to visit.    ALLERGIES: No Known Allergies  FAMILY HISTORY: Family History  Problem Relation Age of Onset   Hypertension Mother    Diabetes Father    Hypertension Father    Stroke Father    Multiple sclerosis Father    Diabetes Brother     Objective:  Blood pressure (!) 150/95, pulse 67, height 6\' 2"  (1.88 m), weight 241 lb 3.2 oz (109.4 kg), SpO2 99 %. General: No acute distress.  Patient appears well-groomed.   Head:  Normocephalic/atraumatic Eyes:  fundi examined but not visualized Neck: supple, no paraspinal tenderness, full range of motion Heart: regular rate and rhythm Neurological Exam: Mental status: alert and oriented to person, place, and time, recent and remote memory intact, fund of knowledge intact,  attention and concentration intact, speech fluent and not dysarthric, language intact. Cranial nerves: CN I: not tested CN II: pupils equal, round and reactive to light, visual fields intact CN III, IV, VI:  full range of motion, no nystagmus, no ptosis CN V: facial sensation intact. CN VII: upper and lower face symmetric CN VIII: hearing intact CN IX, X: gag intact, uvula midline CN XI: sternocleidomastoid and trapezius  muscles intact CN XII: tongue midline Bulk & Tone: normal, no fasciculations. Motor:  muscle strength 5/5 throughout Sensation:  Pinprick, temperature and vibratory sensation intact. Deep Tendon Reflexes:  2+ throughout,  toes downgoing.   Finger to nose testing:  Without dysmetria.   Heel to shin:  Without dysmetria.   Gait:  Normal station and stride.  Romberg negative.    Thank you for allowing me to take part in the care of this patient.  Shon Millet, DO  CC: Janeece Agee, NP

## 2021-09-19 ENCOUNTER — Ambulatory Visit: Payer: BC Managed Care – PPO | Admitting: Neurology

## 2021-09-19 ENCOUNTER — Encounter: Payer: Self-pay | Admitting: Neurology

## 2021-09-19 VITALS — BP 150/95 | HR 67 | Ht 74.0 in | Wt 241.2 lb

## 2021-09-19 DIAGNOSIS — R200 Anesthesia of skin: Secondary | ICD-10-CM | POA: Diagnosis not present

## 2021-09-19 DIAGNOSIS — R202 Paresthesia of skin: Secondary | ICD-10-CM | POA: Diagnosis not present

## 2021-11-15 NOTE — Progress Notes (Unsigned)
Office Visit Note  Patient: Shawn Melendez             Date of Birth: 11/03/1975           MRN: [id number]             PCP: Maximiano Coss, NP Referring: Maximiano Coss, NP Visit Date: 11/17/2021   Subjective:  New Patient (Initial Visit) (Numbness in extremities and left leg. Joint pain in hands and wrists. )   History of Present Illness: Shawn Melendez is a 46 y.o. male here for evaluation of positive ANA checked in association with numbness and paresthesias and family history of MS. he noticed symptoms of numbness and sometimes painful sensation affecting him in both hands and wrist he also is occasionally experiencing symptoms in his upper thighs and buttocks areas.  He does occasionally wake him during the nighttime and has early morning symptoms.  Also frequently experiences his leg symptoms when standing for a prolonged time.  He has had the symptoms to some degree ongoing for years but feels that they were much increased in the past 8 to 10 months after he moved from [place] to [place] for work.  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  Since referral he saw neurology and in interval had resolution of facial numbness. MRI imaging was nonspecific and felt to not represent MS. compared to 6 months ago he feels symptoms are overall partially improved.  Labs reviewed 07/2021 ANA 1:40 homogenous 1:80 speckled ESR 17 CPR <1 Vit D 29.48 B12 292  Activities of Daily Living:  Patient reports morning stiffness for 5 minutes.   Patient Reports nocturnal pain.  Difficulty dressing/grooming: Denies Difficulty climbing stairs: Denies Difficulty getting out of chair: Denies Difficulty using hands for taps, buttons, cutlery, and/or writing: Reports  Review of Systems  Constitutional:  Positive for fatigue.  HENT:  Negative for mouth sores and mouth dryness.   Eyes:  Negative for dryness.  Respiratory:   Negative for shortness of breath.   Cardiovascular:  Negative for chest pain and palpitations.  Gastrointestinal:  Positive for blood in stool. Negative for constipation and diarrhea.  Endocrine: Negative for increased urination.  Genitourinary:  Negative for involuntary urination.  Musculoskeletal:  Positive for joint pain, joint pain and morning stiffness. Negative for gait problem, joint swelling, myalgias, muscle weakness, muscle tenderness and myalgias.  Skin:  Negative for color change, rash, hair loss and sensitivity to sunlight.  Allergic/Immunologic: Negative for susceptible to infections.  Neurological:  Negative for dizziness and headaches.  Hematological:  Negative for swollen glands.  Psychiatric/Behavioral:  Positive for depressed mood and sleep disturbance. The patient is nervous/anxious.     PMFS History:  Patient Active Problem List   Diagnosis Date Noted   Positive ANA (antinuclear antibody) 11/17/2021   Numbness and tingling 06/19/2021   Acid reflux 03/02/2020    Past Medical History:  Diagnosis Date   Anxiety    Depression    GERD (gastroesophageal reflux disease)    Wears contact lenses     Family History  Problem Relation Age of Onset   Hypertension Mother    Diabetes Father    Hypertension Father    Stroke Father    Multiple sclerosis Father    Diabetes Brother    Healthy Daughter    Healthy Daughter    Healthy Son    Healthy Son    Past  Surgical History:  Procedure Laterality Date   ESOPHAGOGASTRODUODENOSCOPY (EGD) WITH PROPOFOL N/A 06/27/2021   Procedure: ESOPHAGOGASTRODUODENOSCOPY (EGD) WITH PROPOFOL;  Surgeon: Lin Landsman, MD;  Location: [facility];  Service: Endoscopy;  Laterality: N/A;   SHOULDER ARTHROSCOPY Right    Social History   Social History Narrative   Not on file    There is no immunization history on file for this patient.   Objective: Vital Signs: BP 133/87 (BP Location: Right Arm, Patient Position:  Sitting, Cuff Size: Normal)   Pulse 69   Resp 13   Ht 6' 1"  (1.854 m)   Wt 242 lb 12.8 oz (110.1 kg)   BMI 32.03 kg/m    Physical Exam HENT:     Right Ear: External ear normal.     Left Ear: External ear normal.     Mouth/Throat:     Mouth: Mucous membranes are moist.     Pharynx: Oropharynx is clear.  Eyes:     Conjunctiva/sclera: Conjunctivae normal.  Cardiovascular:     Rate and Rhythm: Normal rate and regular rhythm.  Pulmonary:     Effort: Pulmonary effort is normal.     Breath sounds: Normal breath sounds.  Musculoskeletal:     Right lower leg: No edema.     Left lower leg: No edema.  Lymphadenopathy:     Cervical: No cervical adenopathy.  Skin:    General: Skin is warm and dry.     Findings: No rash.  Neurological:     General: No focal deficit present.     Mental Status: He is alert.     Motor: No weakness.     Gait: Gait normal.     Deep Tendon Reflexes: Reflexes normal.  Psychiatric:        Mood and Affect: Mood normal.      Musculoskeletal Exam:  Neck full ROM no tenderness Shoulders full ROM no tenderness or swelling Elbows full ROM no tenderness or swelling Wrists full ROM no tenderness or swelling Fingers full ROM no tenderness or swelling Knees full ROM no tenderness or swelling Ankles full ROM no tenderness or swelling MTPs full ROM no tenderness or swelling   Investigation: No additional findings.  Imaging: No results found.  Recent Labs: Lab Results  Component Value Date   WBC 6.7 07/31/2021   HGB 14.3 07/31/2021   PLT 246.0 07/31/2021   NA 136 07/31/2021   K 4.0 07/31/2021   CL 100 07/31/2021   CO2 26 07/31/2021   GLUCOSE 87 07/31/2021   BUN 14 07/31/2021   CREATININE 1.06 07/31/2021   BILITOT 0.9 07/31/2021   ALKPHOS 62 07/31/2021   AST 78 (H) 07/31/2021   ALT 88 (H) 07/31/2021   PROT 7.7 07/31/2021   ALBUMIN 4.7 07/31/2021   CALCIUM 9.1 07/31/2021    Speciality Comments: No specialty comments  available.  Procedures:  No procedures performed Allergies: Patient has no allergy information on record.   Assessment / Plan:     Visit Diagnoses: Positive ANA (antinuclear antibody) - Plan: RNP Antibody, Anti-Smith antibody, Sjogrens syndrome-A extractable nuclear antibody, Anti-DNA antibody, double-stranded, C3 and C4, Rheumatoid factor  Numbness and tingling - Plan: RNP Antibody, Anti-Smith antibody, Sjogrens syndrome-A extractable nuclear antibody, Anti-DNA antibody, double-stranded, C3 and C4, Rheumatoid factor  Orders: Orders Placed This Encounter  Procedures   RNP Antibody   Anti-Smith antibody   Sjogrens syndrome-A extractable nuclear antibody   Anti-DNA antibody, double-stranded   C3 and C4   Rheumatoid factor  No orders of the defined types were placed in this encounter.   Face-to-face time spent with patient was *** minutes. Greater than 50% of time was spent in counseling and coordination of care.  Follow-Up Instructions: No follow-ups on file.   Collier Salina, MD  Note - This record has been created using [employer].  Chart creation errors have been sought, but may not always  have been located. Such creation errors do not reflect on  the standard of medical care.

## 2021-11-17 ENCOUNTER — Encounter: Payer: Self-pay | Admitting: Internal Medicine

## 2021-11-17 ENCOUNTER — Ambulatory Visit: Payer: BC Managed Care – PPO | Attending: Internal Medicine | Admitting: Internal Medicine

## 2021-11-17 VITALS — BP 133/87 | HR 69 | Resp 13 | Ht 73.0 in | Wt 242.8 lb

## 2021-11-17 DIAGNOSIS — R200 Anesthesia of skin: Secondary | ICD-10-CM | POA: Diagnosis not present

## 2021-11-17 DIAGNOSIS — R202 Paresthesia of skin: Secondary | ICD-10-CM | POA: Diagnosis not present

## 2021-11-17 DIAGNOSIS — R768 Other specified abnormal immunological findings in serum: Secondary | ICD-10-CM | POA: Diagnosis not present

## 2021-11-18 LAB — RNP ANTIBODY: Ribonucleic Protein(ENA) Antibody, IgG: <1 AI

## 2021-11-18 LAB — RHEUMATOID FACTOR: Rheumatoid fact SerPl-aCnc: <14 IU/mL (ref <14)

## 2021-11-18 LAB — SJOGRENS SYNDROME-A EXTRACTABLE NUCLEAR ANTIBODY: SSA (Ro) (ENA) Antibody, IgG: <1 AI

## 2021-11-18 LAB — ANTI-SMITH ANTIBODY: ENA SM Ab Ser-aCnc: <1 AI

## 2021-11-18 LAB — C3 AND C4
C3 Complement: 177 mg/dL (ref 82–185)
C4 Complement: 34 mg/dL (ref 15–53)

## 2021-11-18 LAB — ANTI-DNA ANTIBODY, DOUBLE-STRANDED: ds DNA Ab: 7 IU/mL — ABNORMAL HIGH

## 2021-11-20 NOTE — Progress Notes (Signed)
Lab results do not suggest an autoimmune disease is causing current symptoms. One test is indeterminate and others completely negative. I think the next step would be to get a nerve conduction study of both arms done to evaluate this.  If he agrees we can refer for nerve conduction study.

## 2021-11-20 NOTE — Progress Notes (Signed)
Can you help with sending bilateral upper extremity NCS referral?

## 2021-11-21 ENCOUNTER — Other Ambulatory Visit: Payer: Self-pay | Admitting: *Deleted

## 2021-11-21 DIAGNOSIS — R200 Anesthesia of skin: Secondary | ICD-10-CM

## 2021-12-20 ENCOUNTER — Ambulatory Visit: Payer: BC Managed Care – PPO | Admitting: Neurology

## 2022-06-15 DIAGNOSIS — N281 Cyst of kidney, acquired: Secondary | ICD-10-CM | POA: Diagnosis not present

## 2022-06-15 DIAGNOSIS — N200 Calculus of kidney: Secondary | ICD-10-CM | POA: Diagnosis not present

## 2022-09-11 DIAGNOSIS — L578 Other skin changes due to chronic exposure to nonionizing radiation: Secondary | ICD-10-CM | POA: Diagnosis not present

## 2022-09-11 DIAGNOSIS — L814 Other melanin hyperpigmentation: Secondary | ICD-10-CM | POA: Diagnosis not present

## 2022-09-11 DIAGNOSIS — L821 Other seborrheic keratosis: Secondary | ICD-10-CM | POA: Diagnosis not present

## 2022-09-11 DIAGNOSIS — D1801 Hemangioma of skin and subcutaneous tissue: Secondary | ICD-10-CM | POA: Diagnosis not present

## 2022-10-29 ENCOUNTER — Ambulatory Visit: Payer: BC Managed Care – PPO | Admitting: Family Medicine

## 2022-10-29 ENCOUNTER — Encounter: Payer: Self-pay | Admitting: Family Medicine

## 2022-10-29 VITALS — BP 130/80 | HR 57 | Temp 98.7°F | Ht 73.5 in | Wt 231.5 lb

## 2022-10-29 DIAGNOSIS — Z0001 Encounter for general adult medical examination with abnormal findings: Secondary | ICD-10-CM

## 2022-10-29 DIAGNOSIS — G4709 Other insomnia: Secondary | ICD-10-CM | POA: Diagnosis not present

## 2022-10-29 DIAGNOSIS — R400 Somnolence: Secondary | ICD-10-CM | POA: Diagnosis not present

## 2022-10-29 DIAGNOSIS — Z0000 Encounter for general adult medical examination without abnormal findings: Secondary | ICD-10-CM | POA: Diagnosis not present

## 2022-10-29 DIAGNOSIS — Z1211 Encounter for screening for malignant neoplasm of colon: Secondary | ICD-10-CM

## 2022-10-29 DIAGNOSIS — Z13220 Encounter for screening for lipoid disorders: Secondary | ICD-10-CM | POA: Diagnosis not present

## 2022-10-29 DIAGNOSIS — F411 Generalized anxiety disorder: Secondary | ICD-10-CM | POA: Diagnosis not present

## 2022-10-29 DIAGNOSIS — F321 Major depressive disorder, single episode, moderate: Secondary | ICD-10-CM | POA: Diagnosis not present

## 2022-10-29 MED ORDER — SERTRALINE HCL 50 MG PO TABS: 50.0000 mg | ORAL_TABLET | Freq: Every day | ORAL | 3 refills | Status: DC

## 2022-10-29 MED ORDER — HYDROXYZINE PAMOATE 25 MG PO CAPS: 25.0000 mg | ORAL_CAPSULE | Freq: Three times a day (TID) | ORAL | 2 refills | Status: DC | PRN

## 2022-10-29 NOTE — Progress Notes (Unsigned)
Complete physical exam  Patient: Shawn Melendez   DOB: 10-30-1975   47 y.o. Male  MRN: [id number]  Subjective:    Chief Complaint  Patient presents with   Establish Care    Darkiel Feathers is a 47 y.o. male who presents today for a complete physical exam. He reports consuming a general diet. Gym/ health club routine includes cardio and 2-3 times a week. He generally feels well. He reports sleeping poorly. He does have additional problems to discuss today. He reports that his wife does tell him that he snores, is waking up hourly sometimes, feels exhausted during the day.   Patient is reporting and increase in mood and anxiety symptoms. Reports it has been very bad since he moved here, seems to be getting worse. States this has happened to him in the past. Was taking sertraline and this was helping him. States that after a few years he was feeling fine so he gradually stopped the medication. States that he would like to restart it.   Most recent fall risk assessment:    07/31/2021   11:22 AM  Fall Risk   Falls in the past year? 0  Number falls in past yr: 0  Injury with Fall? 0     Most recent depression screenings:    10/29/2022    2:30 PM 07/31/2021   11:22 AM  PHQ 2/9 Scores  PHQ - 2 Score 4 2  PHQ- 9 Score 15 7      10/29/2022    2:31 PM  GAD 7 : Generalized Anxiety Score  Nervous, Anxious, on Edge 3  Control/stop worrying 3  Worry too much - different things 2  Trouble relaxing 2  Restless 2  Easily annoyed or irritable 3  Afraid - awful might happen 2  Total GAD 7 Score 17  Anxiety Difficulty Very difficult     Vision:Within last year and Dental: No current dental problems and Receives regular dental care  Patient Active Problem List   Diagnosis Date Noted   Daytime somnolence 10/30/2022   Other insomnia 10/30/2022   Depression, major, single episode, moderate (HCC) 10/29/2022   Generalized anxiety disorder 10/29/2022   Positive ANA (antinuclear antibody)  11/17/2021   Numbness and tingling 06/19/2021   Acid reflux 03/02/2020      Patient Care Team: Karie Georges, MD as PCP - General (Family Medicine) Drema Dallas, DO as Consulting Physician (Neurology)   Outpatient Medications Prior to Visit  Medication Sig   omeprazole (PRILOSEC) 40 MG capsule Take 40 mg by mouth as needed.   [DISCONTINUED] Omeprazole 20 MG TBDD 40 mg.   No facility-administered medications prior to visit.    Review of Systems  HENT:  Negative for hearing loss.   Eyes:  Negative for blurred vision.  Respiratory:  Negative for shortness of breath.   Cardiovascular:  Negative for chest pain.  Gastrointestinal: Negative.   Genitourinary: Negative.   Musculoskeletal:  Negative for back pain.  Neurological:  Negative for headaches.  Psychiatric/Behavioral:  Positive for depression. The patient is nervous/anxious and has insomnia.   All other systems reviewed and are negative.      Objective:     BP 130/80 (BP Location: Left Arm, Patient Position: Sitting, Cuff Size: Large)   Pulse (!) 57 Comment: 57  Temp 98.7 F (37.1 C) (Oral)   Ht 6' 1.5" (1.867 m)   Wt 231 lb 8 oz (105 kg)   SpO2 100%   BMI 30.13 kg/m  Physical Exam Vitals reviewed.  Constitutional:      Appearance: Normal appearance. He is well-groomed and normal weight.  HENT:     Right Ear: Tympanic membrane and ear canal normal.     Left Ear: Tympanic membrane and ear canal normal.     Mouth/Throat:     Mouth: Mucous membranes are moist.     Pharynx: No posterior oropharyngeal erythema.  Eyes:     Extraocular Movements: Extraocular movements intact.     Conjunctiva/sclera: Conjunctivae normal.  Neck:     Thyroid: No thyromegaly.  Cardiovascular:     Rate and Rhythm: Normal rate and regular rhythm.     Heart sounds: S1 normal and S2 normal. No murmur heard. Pulmonary:     Effort: Pulmonary effort is normal.     Breath sounds: Normal breath sounds and air entry. No rales.   Abdominal:     General: Abdomen is flat. Bowel sounds are normal.  Musculoskeletal:     Right lower leg: No edema.     Left lower leg: No edema.  Lymphadenopathy:     Cervical: No cervical adenopathy.  Neurological:     General: No focal deficit present.     Mental Status: He is alert and oriented to person, place, and time.     Gait: Gait is intact.  Psychiatric:        Mood and Affect: Mood and affect normal.         Assessment & Plan:    Routine Health Maintenance and Physical Exam   There is no immunization history on file for this patient.  Health Maintenance  Topic Date Due   DTaP/Tdap/Td (1 - Tdap) Never done   Colonoscopy  Never done   COVID-19 Vaccine (1) 11/14/2022 (Originally 07/09/1982)   Hepatitis C Screening  10/29/2023 (Originally 07/09/1995)   HIV Screening  10/29/2023 (Originally 07/08/1992)   INFLUENZA VACCINE  11/01/2022   HPV VACCINES  Aged Out    Discussed health benefits of physical activity, and encouraged him to engage in regular exercise appropriate for his age and condition.  Encounter for general adult medical examination with abnormal findings -     physical exam findings are normal. However there are abnormal PHQ and GAD scoring, see below. Handouts given on healthy eating and exercise. I counseled patient on sleep hygiene/ healthy patterns.  Comprehensive metabolic panel -     CBC with Differential/Platelet  Lipid screening -     Lipid panel  Colon cancer screening -     Cologuard  Depression, major, single episode, moderate (HCC) Assessment & Plan: Patient is scoring in the moderate area for both GAD and MDD. I will restart his sertraline, starting at 50 mg daily. I will see him back in 2 months for a video visit to reassess his symptoms and adjust his medication if needed.   Orders: -     Sertraline HCl; Take 1 tablet (50 mg total) by mouth daily.  Dispense: 30 tablet; Refill: 3 -     TSH  Generalized anxiety disorder -      Sertraline HCl; Take 1 tablet (50 mg total) by mouth daily.  Dispense: 30 tablet; Refill: 3  Other insomnia Assessment & Plan: Will start patient on hydroxyzine 25 mg at bedtime to help with sleep.   Orders: -     hydrOXYzine Pamoate; Take 1 capsule (25 mg total) by mouth every 8 (eight) hours as needed.  Dispense: 30 capsule; Refill: 2  Daytime somnolence Assessment &  Plan: I am concerned that his sleep issues may be OSA, I will send a referral to pulmonary for sleep study.   Orders: -     Ambulatory referral to Pulmonology    Return in about 2 months (around 12/30/2022), or video visit for follow up on mood symptoms.     Karie Georges, MD

## 2022-10-30 DIAGNOSIS — G4709 Other insomnia: Secondary | ICD-10-CM | POA: Insufficient documentation

## 2022-10-30 DIAGNOSIS — R400 Somnolence: Secondary | ICD-10-CM | POA: Insufficient documentation

## 2022-10-30 NOTE — Assessment & Plan Note (Signed)
Patient is scoring in the moderate area for both GAD and MDD. I will restart his sertraline, starting at 50 mg daily. I will see him back in 2 months for a video visit to reassess his symptoms and adjust his medication if needed.

## 2022-10-30 NOTE — Assessment & Plan Note (Signed)
I am concerned that his sleep issues may be OSA, I will send a referral to pulmonary for sleep study.

## 2022-10-30 NOTE — Assessment & Plan Note (Signed)
Will start patient on hydroxyzine 25 mg at bedtime to help with sleep.

## 2022-12-27 ENCOUNTER — Telehealth (INDEPENDENT_AMBULATORY_CARE_PROVIDER_SITE_OTHER): Payer: BC Managed Care – PPO | Admitting: Family Medicine

## 2022-12-27 ENCOUNTER — Encounter: Payer: Self-pay | Admitting: Family Medicine

## 2022-12-27 DIAGNOSIS — R400 Somnolence: Secondary | ICD-10-CM | POA: Diagnosis not present

## 2022-12-27 DIAGNOSIS — G4709 Other insomnia: Secondary | ICD-10-CM | POA: Diagnosis not present

## 2022-12-27 DIAGNOSIS — F321 Major depressive disorder, single episode, moderate: Secondary | ICD-10-CM

## 2022-12-27 DIAGNOSIS — F411 Generalized anxiety disorder: Secondary | ICD-10-CM

## 2022-12-27 MED ORDER — HYDROXYZINE PAMOATE 25 MG PO CAPS: 25.0000 mg | ORAL_CAPSULE | Freq: Three times a day (TID) | ORAL | 2 refills | Status: DC | PRN

## 2022-12-27 MED ORDER — SERTRALINE HCL 50 MG PO TABS
50.0000 mg | ORAL_TABLET | Freq: Every day | ORAL | 1 refills | Status: DC
Start: 2022-12-27 — End: 2023-10-08

## 2022-12-27 NOTE — Progress Notes (Signed)
Patient unable to check vital signs at home for the virtual visit.

## 2022-12-27 NOTE — Progress Notes (Signed)
Virtual Medical Office Visit  Patient:  Shawn Melendez      Age: 47 y.o.       Sex:  male  Date:   12/31/2022  PCP:    Karie Georges, MD   Today's Healthcare Provider: Karie Georges, MD    Assessment/Plan:   Summary assessment:  Jozef was seen today for medical management of chronic issues.  Daytime somnolence Assessment & Plan: Has appt coming up with pulmonary to arrange the sleep study   Other insomnia Assessment & Plan: Hydroxyzine is working to get him more sleep at night, however he is still only getting around 4-5 hours, he does states that this is an improvement, will continue the hydroxyzine until his sleep study is complete.   Orders: -     hydrOXYzine Pamoate; Take 1 capsule (25 mg total) by mouth every 8 (eight) hours as needed.  Dispense: 90 capsule; Refill: 2  Depression, major, single episode, moderate (HCC) Assessment & Plan: Pt reports improvement in his symptoms today with the 50 mg dose, will continue as prescribed.   Orders: -     Sertraline HCl; Take 1 tablet (50 mg total) by mouth daily.  Dispense: 90 tablet; Refill: 1  Generalized anxiety disorder -     Sertraline HCl; Take 1 tablet (50 mg total) by mouth daily.  Dispense: 90 tablet; Refill: 1     Return in about 6 months (around 06/26/2023).   He was advised to call the office or go to ER if his condition worsens    Subjective:   Shawn Melendez is a 47 y.o. male with PMH significant for: Past Medical History:  Diagnosis Date   Anxiety    Blood in stool    Depression    GERD (gastroesophageal reflux disease)    Wears contact lenses      Presenting today with: Chief Complaint  Patient presents with   Medical Management of Chronic Issues     He clarifies and reports that his condition: Patient is here for follow up on the anxiety and depression symptoms. States that he started the sertraline, had some diarrhea as a side effect but this seems to be improving. States that  he does feel like it is helping, states that the mind racing is better, states that his stress reaction. Patient wants to stay on this dosage for now to see how things go. States that the hydroxyzine is improving his sleep. States that he is getting about 5 hours at night. Reports that he has an appt on October 28th with the pulmonologist to rule out sleep apnea.  We discussed his cholesterol elevations and his liver function. Counseled patient on reducing highly processed foods in his diet and increasing exercise. Pt wants to try this for 6 months and we will recheck his LFT's at the next visit.   He denies having any: Side effects          Objective/Observations  Physical Exam:  Polite and friendly Gen: NAD, resting comfortably Pulm: Normal work of breathing Neuro: Grossly normal, moves all extremities Psych: Normal affect and thought content Problem specific physical exam findings: N/A   No images are attached to the encounter or orders placed in the encounter.    Results: No results found for any visits on 12/27/22.        Virtual Visit via Video   I connected with Veto Kemps on 12/31/22 at  8:30 AM EDT by a video enabled telemedicine application and  verified that I am speaking with the correct person using two identifiers. The limitations of evaluation and management by telemedicine and the availability of in person appointments were discussed. The patient expressed understanding and agreed to proceed.   Percentage of appointment time on video:  100% Patient location: Home Provider location: [facility] Persons participating in the virtual visit: Myself and Patient

## 2022-12-31 NOTE — Assessment & Plan Note (Signed)
Has appt coming up with pulmonary to arrange the sleep study

## 2022-12-31 NOTE — Assessment & Plan Note (Signed)
Hydroxyzine is working to get him more sleep at night, however he is still only getting around 4-5 hours, he does states that this is an improvement, will continue the hydroxyzine until his sleep study is complete.

## 2022-12-31 NOTE — Assessment & Plan Note (Signed)
Pt reports improvement in his symptoms today with the 50 mg dose, will continue as prescribed.

## 2023-01-28 ENCOUNTER — Encounter: Payer: Self-pay | Admitting: Adult Health

## 2023-01-28 ENCOUNTER — Ambulatory Visit (INDEPENDENT_AMBULATORY_CARE_PROVIDER_SITE_OTHER): Payer: BC Managed Care – PPO | Admitting: Adult Health

## 2023-01-28 VITALS — BP 136/84 | HR 82 | Ht 74.0 in | Wt 248.6 lb

## 2023-01-28 DIAGNOSIS — R0683 Snoring: Secondary | ICD-10-CM | POA: Diagnosis not present

## 2023-01-28 NOTE — Patient Instructions (Addendum)
Set up for home sleep study  Work on healthy sleep regimen  Do not drive if sleepy  Sleep with head of bed at 30 degree incline  Follow up in 6 weeks to discuss sleep study results and treatment plan

## 2023-01-28 NOTE — Assessment & Plan Note (Addendum)
Loud snoring, restless sleep, daytime sleepiness concerning for sleep apnea. Patient education on OSA . Set up for home sleep study  - discussed how weight can impact sleep and risk for sleep disordered breathing - discussed options to assist with weight loss: combination of diet modification, cardiovascular and strength training exercises   - had an extensive discussion regarding the adverse health consequences related to untreated sleep disordered breathing - specifically discussed the risks for hypertension, coronary artery disease, cardiac dysrhythmias, cerebrovascular disease, and diabetes - lifestyle modification discussed   - discussed how sleep disruption can increase risk of accidents, particularly when driving - safe driving practices were discussed   Plan  Patient Instructions  Set up for home sleep study  Work on healthy sleep regimen  Do not drive if sleepy  Sleep with head of bed at 30 degree incline  Follow up in 6 weeks to discuss sleep study results and treatment plan

## 2023-01-28 NOTE — Progress Notes (Signed)
@Patient  ID: Shawn Melendez, male    DOB: 11-10-1975, 47 y.o.   MRN: [id number]  Chief Complaint  Patient presents with   Consult    Referring provider: Karie Georges, MD  HPI: 47 year old male seen for sleep consult January 28, 2023 for snoring, restless sleep and daytime sleepiness  TEST/EVENTS :   01/28/2023 Sleep consult  Patient presents for sleep consult today for snoring, restless sleep and daytime sleepiness.  Kindly referred by primary care provider Karie Georges, MD .  Patient complains of feeling tired when waking up.  His sleep is very fragmented and restless.  Has low energy.  Feels sleepy throughout the day.. Typically goes to bed about 10 PM.  Takes up to 20 minutes to go to sleep.  Is up multiple times throughout the night.  Gets up at 7 AM.  Wakes up feeling unrefreshed.  Weight is up 12 pounds over the last 2 years.  Current weight is at 248 pounds with a BMI of 31.  No history of congestive heart failure or stroke.  No symptoms suspicious for cataplexy or sleep paralysis.  Does not take any sleep aids.  Does not nap.  Takes Hydroxyzine At bedtime  for insomnia . It helps him get to sleep and stay asleep for at least 4hr . Then wakes up frequently until time to get up . If he sleeps at incline snoring is some better. Has minimum caffeine intake.  Epworth score is 6 out of 24.  Typically gets sleepy if he sits down to rest, watch TV and in the evening hours. Has never had a sleep study before. No removeable   SH : Married, has 4 children. Never smoker . Drinks 2-4 mixed drinks each evening. Works as Building services engineer, dayshift-currently unemployed.   FH : Pancreatic cancer .   Past Surgical History:  Procedure Laterality Date   ESOPHAGOGASTRODUODENOSCOPY (EGD) WITH PROPOFOL N/A 06/27/2021   Procedure: ESOPHAGOGASTRODUODENOSCOPY (EGD) WITH PROPOFOL;  Surgeon: Toney Reil, MD;  Location: [facility];  Service: Endoscopy;  Laterality: N/A;   ROTATOR CUFF REPAIR  Right    2000   SHOULDER ARTHROSCOPY Right       No Known Allergies   There is no immunization history on file for this patient.  Past Medical History:  Diagnosis Date   Anxiety    Blood in stool    Depression    GERD (gastroesophageal reflux disease)    Wears contact lenses     Tobacco History: Social History   Tobacco Use  Smoking Status Never  Smokeless Tobacco Never   Counseling given: Not Answered   Outpatient Medications Prior to Visit  Medication Sig Dispense Refill   hydrOXYzine (VISTARIL) 25 MG capsule Take 1 capsule (25 mg total) by mouth every 8 (eight) hours as needed. (Patient taking differently: Take 25 mg by mouth every 8 (eight) hours as needed. Prior to bedtime for sleep) 90 capsule 2   sertraline (ZOLOFT) 50 MG tablet Take 1 tablet (50 mg total) by mouth daily. 90 tablet 1   omeprazole (PRILOSEC) 40 MG capsule Take 40 mg by mouth as needed. (Patient not taking: Reported on 01/28/2023)     No facility-administered medications prior to visit.     Review of Systems:   Constitutional:   No  weight loss, night sweats,  Fevers, chills, +fatigue, or  lassitude.  HEENT:   No headaches,  Difficulty swallowing,  Tooth/dental problems, or  Sore throat,  No sneezing, itching, ear ache, nasal congestion, post nasal drip,   CV:  No chest pain,  Orthopnea, PND, swelling in lower extremities, anasarca, dizziness, palpitations, syncope.   GI  No heartburn, indigestion, abdominal pain, nausea, vomiting, diarrhea, change in bowel habits, loss of appetite, bloody stools.   Resp: No shortness of breath with exertion or at rest.  No excess mucus, no productive cough,  No non-productive cough,  No coughing up of blood.  No change in color of mucus.  No wheezing.  No chest wall deformity  Skin: no rash or lesions.  GU: no dysuria, change in color of urine, no urgency or frequency.  No flank pain, no hematuria   MS:  No joint pain or swelling.  No  decreased range of motion.  No back pain.    Physical Exam  BP 136/84 (BP Location: Left Arm, Patient Position: Sitting, Cuff Size: Large)   Pulse 82   Ht 6\' 2"  (1.88 m)   Wt 248 lb 9.6 oz (112.8 kg)   SpO2 97%   BMI 31.92 kg/m   GEN: A/Ox3; pleasant , NAD, well nourished    HEENT:  [place]/AT,  NOSE-clear, THROAT-clear, no lesions, no postnasal drip or exudate noted.  Class 3 MP airway   NECK:  Supple w/ fair ROM; no JVD; normal carotid impulses w/o bruits; no thyromegaly or nodules palpated; no lymphadenopathy.    RESP  Clear  P & A; w/o, wheezes/ rales/ or rhonchi. no accessory muscle use, no dullness to percussion  CARD:  RRR, no m/r/g, no peripheral edema, pulses intact, no cyanosis or clubbing.  GI:   Soft & nt; nml bowel sounds; no organomegaly or masses detected.   Musco: Warm bil, no deformities or joint swelling noted.   Neuro: alert, no focal deficits noted.    Skin: Warm, no lesions or rashes    Lab Results:   BNP No results found for: "BNP"  ProBNP No results found for: "PROBNP"  Imaging: No results found.  Administration History     None           No data to display          No results found for: "NITRICOXIDE"      Assessment & Plan:   Snoring Loud snoring, restless sleep, daytime sleepiness concerning for sleep apnea. Patient education on OSA . Set up for home sleep study  - discussed how weight can impact sleep and risk for sleep disordered breathing - discussed options to assist with weight loss: combination of diet modification, cardiovascular and strength training exercises   - had an extensive discussion regarding the adverse health consequences related to untreated sleep disordered breathing - specifically discussed the risks for hypertension, coronary artery disease, cardiac dysrhythmias, cerebrovascular disease, and diabetes - lifestyle modification discussed   - discussed how sleep disruption can increase risk of  accidents, particularly when driving - safe driving practices were discussed   Plan  Patient Instructions  Set up for home sleep study  Work on healthy sleep regimen  Do not drive if sleepy  Sleep at head of bed at 30 degree incline  Follow up in 6 weeks to discuss sleep study results and treatment plan       Rubye Oaks, NP 01/28/2023

## 2023-03-13 ENCOUNTER — Telehealth: Payer: Self-pay | Admitting: Adult Health

## 2023-03-13 NOTE — Telephone Encounter (Signed)
PT has [employer]. He states he still has not been sent his HST machine since October. I see that his insurance was invalid in notes.  PT states to please -re-run it as Jeannette Corpus is in place. With [employer] the information (member ID etc) will be the same as his former [employer] on the card. Please call PT to advise status. Thanks.

## 2023-03-15 ENCOUNTER — Ambulatory Visit: Payer: BC Managed Care – PPO

## 2023-03-15 DIAGNOSIS — R0683 Snoring: Secondary | ICD-10-CM

## 2023-03-22 ENCOUNTER — Telehealth: Payer: Self-pay | Admitting: Adult Health

## 2023-04-07 ENCOUNTER — Other Ambulatory Visit: Payer: Self-pay | Admitting: Family Medicine

## 2023-04-07 DIAGNOSIS — G4709 Other insomnia: Secondary | ICD-10-CM

## 2023-04-12 ENCOUNTER — Encounter: Payer: Self-pay | Admitting: Adult Health

## 2023-04-12 ENCOUNTER — Telehealth: Payer: Self-pay | Admitting: Adult Health

## 2023-04-12 DIAGNOSIS — G4733 Obstructive sleep apnea (adult) (pediatric): Secondary | ICD-10-CM

## 2023-04-12 NOTE — Progress Notes (Signed)
 Virtual Visit via Video Note  I connected with Shawn Melendez on 04/12/23 at  2:30 PM EST by a video enabled telemedicine application and verified that I am speaking with the correct person using two identifiers.  Location: Patient: Home  Provider: Office    I discussed the limitations of evaluation and management by telemedicine and the availability of in person appointments. The patient expressed understanding and agreed to proceed.  History of Present Illness: 48 year old male seen for sleep consult January 28, 2023 for snoring and daytime sleepiness found to have moderate obstructive sleep apnea  Today's video visit is a 36-month follow-up to discuss sleep study results.  Patient was seen in October for sleep consult for daytime sleepiness and snoring.  He was set up for a home sleep study that was done on March 20, 2023 that showed moderate sleep apnea with a AHI at 17.8/hour and SpO2 low at 84%.  We discussed his sleep study results in detail.  Went over treatment options.  Patient would like to proceed with CPAP therapy.  He has significant symptom burden with snoring, restless sleep and daytime sleepiness.  Past Medical History:  Diagnosis Date   Anxiety    Blood in stool    Depression    GERD (gastroesophageal reflux disease)    Wears contact lenses    Current Outpatient Medications on File Prior to Visit  Medication Sig Dispense Refill   hydrOXYzine  (VISTARIL ) 25 MG capsule TAKE 1 CAPSULE (25 MG TOTAL) BY MOUTH EVERY 8 (EIGHT) HOURS AS NEEDED. 90 capsule 2   omeprazole (PRILOSEC) 40 MG capsule Take 40 mg by mouth as needed. (Patient not taking: Reported on 01/28/2023)     sertraline  (ZOLOFT ) 50 MG tablet Take 1 tablet (50 mg total) by mouth daily. 90 tablet 1   No current facility-administered medications on file prior to visit.      Observations/Objective: 04/12/2023 Appears well in no acute distress  Assessment and Plan: Moderate obstructive sleep apnea.  Patient  education given on sleep apnea.  We discussed his sleep study results in detail.  Went over treatment options including weight loss, positional sleep, and CPAP therapy.  Patient would like to proceed with CPAP.  Will begin auto CPAP 5 to 15 cm H2O.  - discussed how weight can impact sleep and risk for sleep disordered breathing - discussed options to assist with weight loss: combination of diet modification, cardiovascular and strength training exercises   - had an extensive discussion regarding the adverse health consequences related to untreated sleep disordered breathing - specifically discussed the risks for hypertension, coronary artery disease, cardiac dysrhythmias, cerebrovascular disease, and diabetes - lifestyle modification discussed   - discussed how sleep disruption can increase risk of accidents, particularly when driving - safe driving practices were discussed   Plan  Patient Instructions  Begin CPAP therapy at night goal is to wear all night long for at least 6 or more hours Work on healthy weight  Do not drive if sleepy  Follow up with Dr. Neda or Crystal Ellwood NP in 3 months and As needed      Follow Up Instructions:    I discussed the assessment and treatment plan with the patient. The patient was provided an opportunity to ask questions and all were answered. The patient agreed with the plan and demonstrated an understanding of the instructions.   The patient was advised to call back or seek an in-person evaluation if the symptoms worsen or if the condition fails to improve  as anticipated.  I provided 21  minutes of non-face-to-face time during this encounter.   Madelin Stank, NP

## 2023-04-12 NOTE — Patient Instructions (Signed)
 Begin CPAP therapy at night goal is to wear all night long for at least 6 or more hours Work on healthy weight  Do not drive if sleepy  Follow up with Dr. Wynona Neat or Evi Mccomb NP in 3 months and As needed

## 2023-05-01 ENCOUNTER — Other Ambulatory Visit: Payer: Self-pay | Admitting: Urology

## 2023-05-01 DIAGNOSIS — N281 Cyst of kidney, acquired: Secondary | ICD-10-CM

## 2023-09-05 IMAGING — MR MR HEAD WO/W CM
14 series · 48 of 48 positions shown · IV contrast (multihance)
Comparison: No pertinent prior exams available for comparison.

CLINICAL DATA: Provided history: Neuropathy. Weakness. Facial
numbness. Family history of multiple sclerosis. Motor neuron
disease. Motor neuron disease suspected, rule out MS, dizziness,
numbness, heaviness in limbs, weakness, tingling in face. Additional
history provided by scanning technologist: Patient reports numbness
and tingling in bilateral arms, legs and face for 5-6 weeks. Leg
heaviness. Generalized weakness. Vision problems.

EXAM:
MRI HEAD WITHOUT AND WITH CONTRAST
TECHNIQUE: Multiplanar, multiecho pulse sequences of the brain and surrounding
structures were obtained without and with intravenous contrast.
CONTRAST:  20mL MULTIHANCE GADOBENATE DIMEGLUMINE 529 MG/ML IV SOLN

[Series 5: T1 · sagittal · 4.0mm · 0.75mm/px · 1 of 31 slices shown (1 of 3)]
[im 1/31]
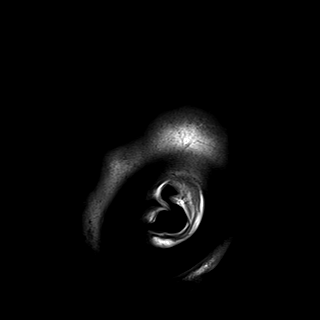

[Series 6: DWI · axial · 3.0mm · 0.94mm/px · z∈[-74,+91]mm · 8 of 188 slices shown (1 of 3)]
[im 1/188]
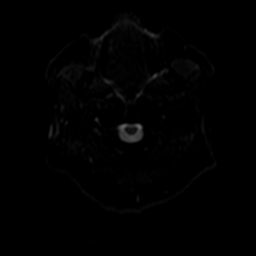
[im 27/188]
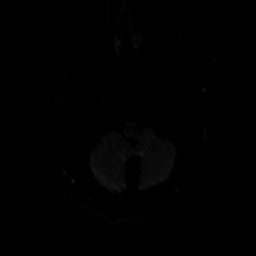
[im 54/188]
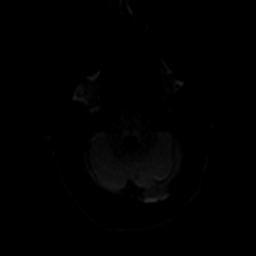
[im 81/188]
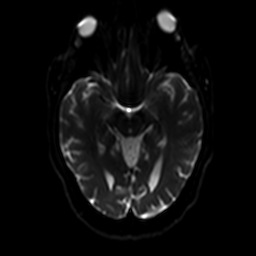
[im 107/188]
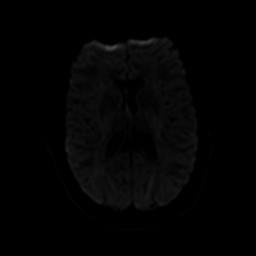
[im 134/188]
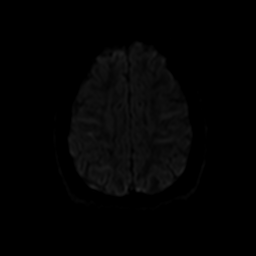
[im 161/188]
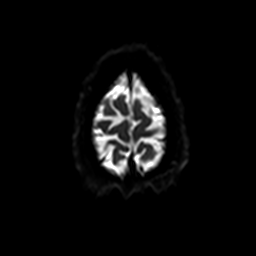
[im 188/188]
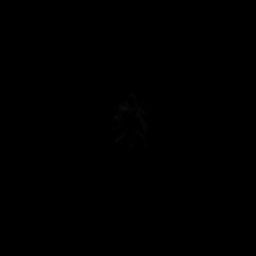

[Series 7: ax dwi_tracew · axial · 3.0mm · 0.94mm/px · z∈[-74,+91]mm · 4 of 94 slices shown]
[im 1/94]
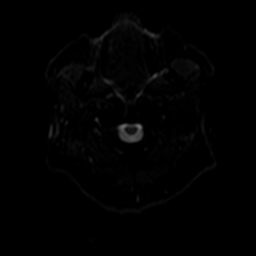
[im 32/94]
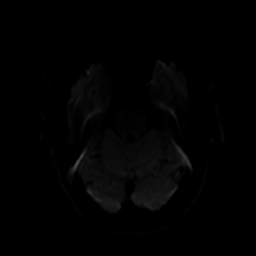
[im 63/94]
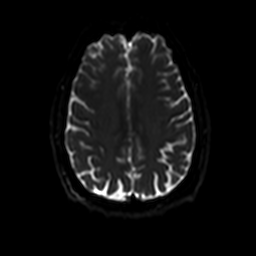
[im 94/94]
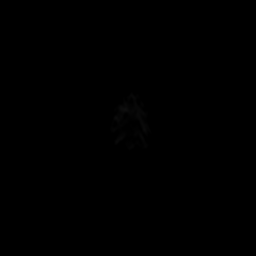

[Series 8: ax dwi_adc · axial · 3.0mm · 0.94mm/px · z∈[-74,+91]mm · 2 of 46 slices shown]
[im 1/46]
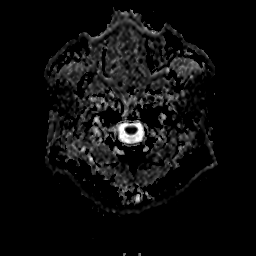
[im 46/46]
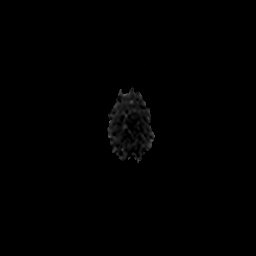

[Series 9: DWI · coronal · 5.0mm · 1.44mm/px · 4 of 74 slices shown (2 of 3)]
[im 1/74]
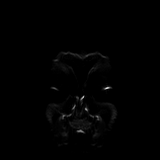
[im 25/74]
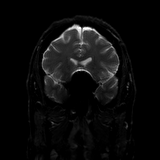
[im 49/74]
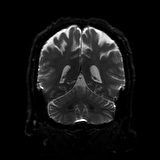
[im 74/74]
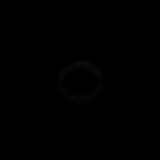

[Series 10: DWI · coronal · 5.0mm · 1.44mm/px · 2 of 37 slices shown (3 of 3)]
[im 1/37]
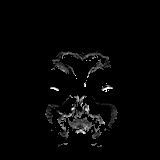
[im 37/37]
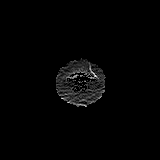

[Series 11: T2 · axial · 4.0mm · 0.36mm/px · z∈[-74,+92]mm · 2 of 33 slices shown]
[im 1/33]
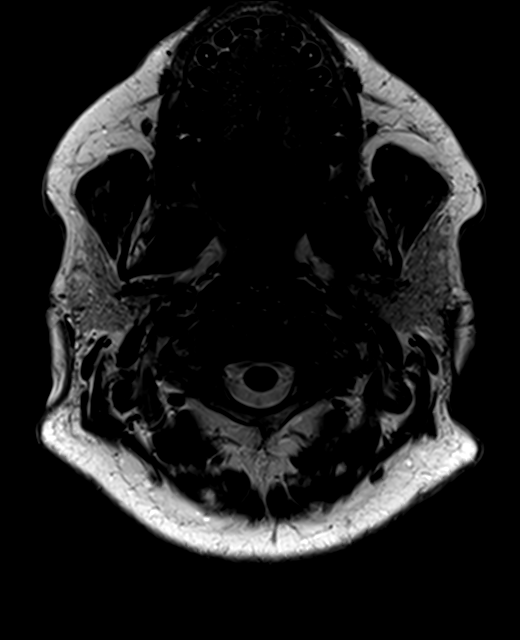
[im 33/33]
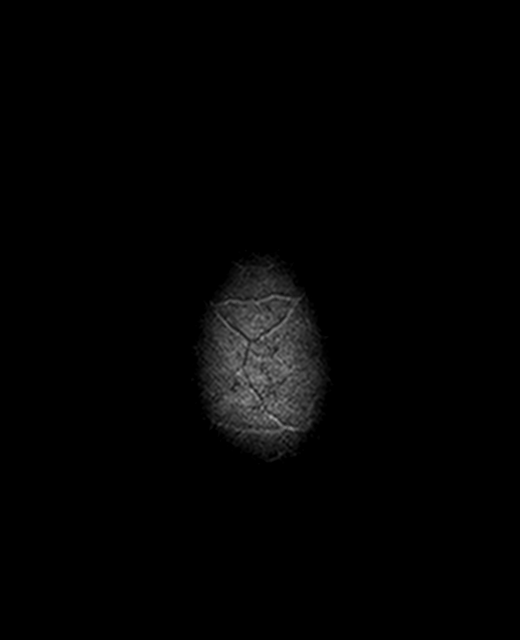

[Series 12: FLAIR · axial · 3.0mm · 0.72mm/px · 1 of 28 slices shown (1 of 2)]
[im 1/28]
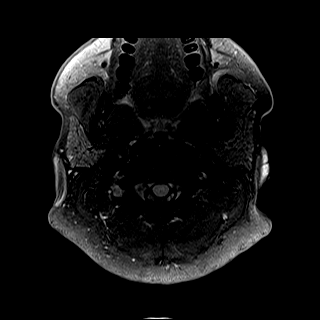

[Series 14: swi_images · axial · 3.0mm · 0.90mm/px · z∈[-73,+91]mm · 3 of 56 slices shown]
[im 1/56]
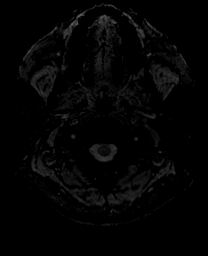
[im 28/56]
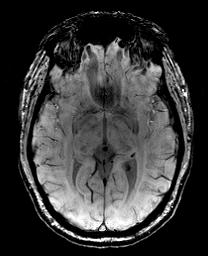
[im 56/56]
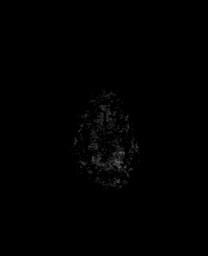

[Series 15: T1 · axial · 1.0mm · 0.90mm/px · z∈[-79,+79]mm · 8 of 160 slices shown (2 of 3)]
[im 1/160]
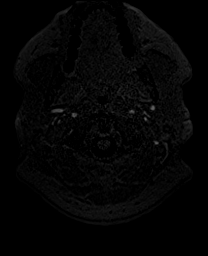
[im 23/160]
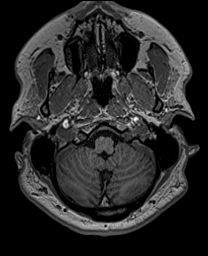
[im 46/160]
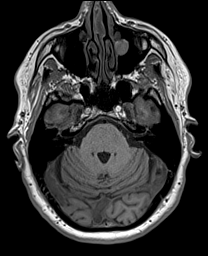
[im 69/160]
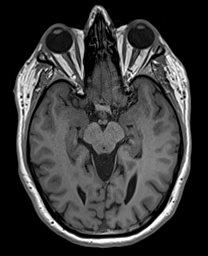
[im 91/160]
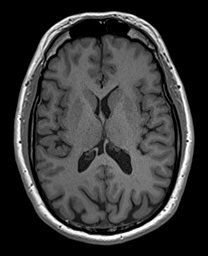
[im 114/160]
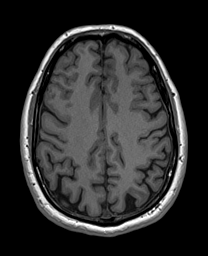
[im 137/160]
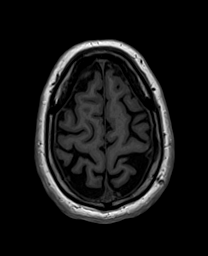
[im 160/160]
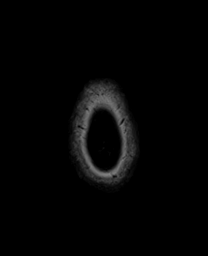

[Series 16: FLAIR · sagittal · 4.0mm · 0.72mm/px · 1 of 28 slices shown (2 of 2)]
[im 1/28]
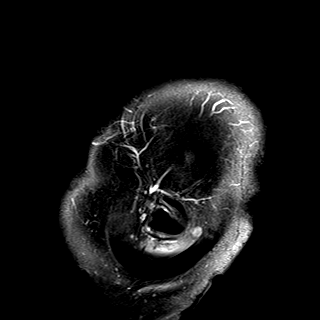

[Series 17: T2 post-contrast · coronal · 4.5mm · 0.36mm/px · 2 of 35 slices shown]
[im 1/35]
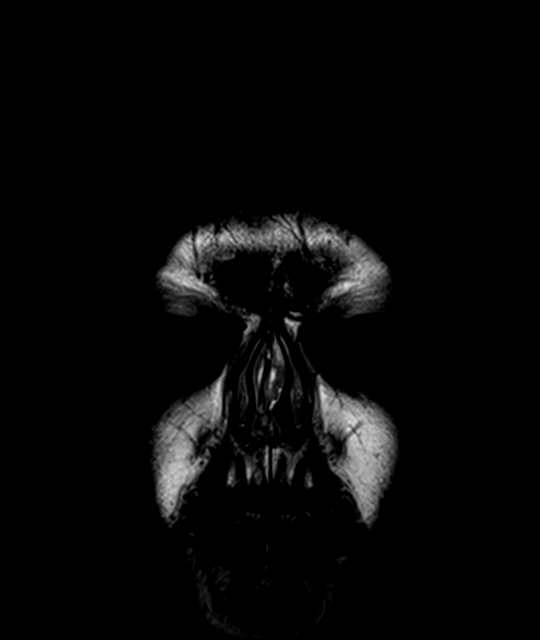
[im 35/35]
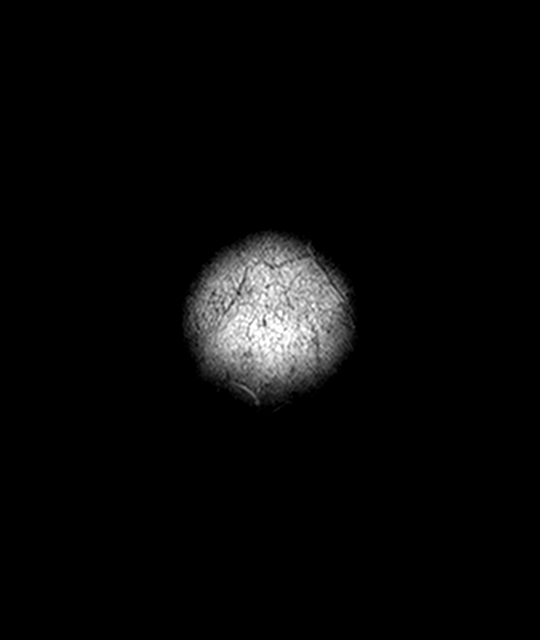

[Series 18: T1 · axial · 1.0mm · 0.90mm/px · z∈[-79,+79]mm · 8 of 160 slices shown (3 of 3)]
[im 1/160]
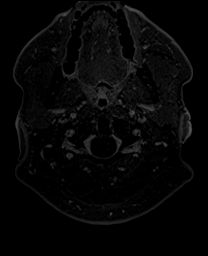
[im 23/160]
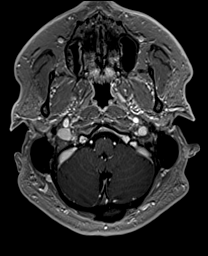
[im 46/160]
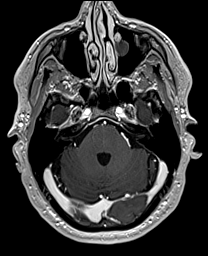
[im 69/160]
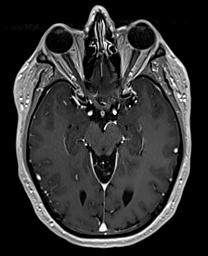
[im 91/160]
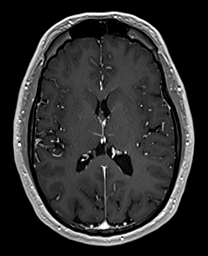
[im 114/160]
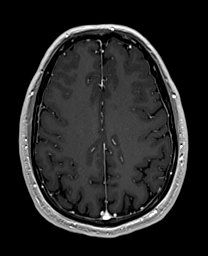
[im 137/160]
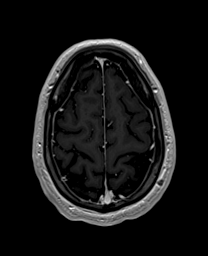
[im 160/160]
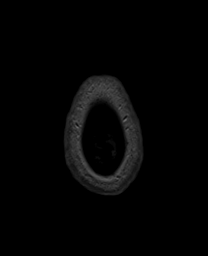

[Series 19: T1 post-contrast · coronal · 4.5mm · 0.72mm/px · 2 of 35 slices shown]
[im 1/35]
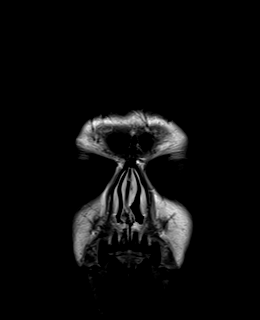
[im 35/35]
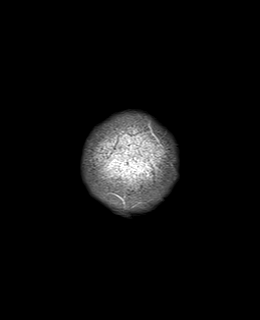

[48 of 48 positions shown; findings below may reference images not displayed]

FINDINGS: Brain:

No age-advanced or lobar predominant parenchymal atrophy.

Two small foci of T2 FLAIR hyperintense signal abnormality within
the cerebral white matter (measuring up to 2 mm), within the left
frontal and right parietal lobes (series 12, images 19 and 16).

No cortical encephalomalacia is identified.

There is no acute infarct.

No evidence of an intracranial mass.

No chronic intracranial blood products.

No extra-axial fluid collection.

No midline shift.

No pathologic intracranial enhancement identified.

Vascular: Maintained flow voids within the proximal large arterial
vessels. Small left frontal lobe developmental venous anomaly
(anatomic variant).

Skull and upper cervical spine: No focal suspicious marrow lesion.

Sinuses/Orbits: No mass or acute finding within the imaged orbits.
Small mucous retention cyst, and mild mucosal thickening, within the
right maxillary sinus. 15 mm mucous retention cyst, and background
trace mucosal thickening, within the left maxillary sinus. Trace
mucosal thickening within the bilateral ethmoid sinuses.
IMPRESSION: No evidence of acute intracranial abnormality.

Two small nonspecific T2 FLAIR hyperintense remote insults within
the cerebral white matter (measuring up to 2 mm), within the left
frontal and right parietal lobes.

Otherwise unremarkable MRI appearance of the brain.

Paranasal sinus disease, as described.

## 2023-09-25 ENCOUNTER — Encounter: Payer: Self-pay | Admitting: Urology

## 2023-10-01 ENCOUNTER — Other Ambulatory Visit

## 2023-10-08 ENCOUNTER — Encounter: Payer: Self-pay | Admitting: Family Medicine

## 2023-10-08 ENCOUNTER — Ambulatory Visit (INDEPENDENT_AMBULATORY_CARE_PROVIDER_SITE_OTHER): Admitting: Family Medicine

## 2023-10-08 VITALS — BP 120/84 | HR 61 | Temp 98.7°F | Ht 73.5 in | Wt 254.8 lb

## 2023-10-08 DIAGNOSIS — F411 Generalized anxiety disorder: Secondary | ICD-10-CM | POA: Diagnosis not present

## 2023-10-08 DIAGNOSIS — Z0000 Encounter for general adult medical examination without abnormal findings: Secondary | ICD-10-CM

## 2023-10-08 DIAGNOSIS — F321 Major depressive disorder, single episode, moderate: Secondary | ICD-10-CM

## 2023-10-08 DIAGNOSIS — Z13220 Encounter for screening for lipoid disorders: Secondary | ICD-10-CM | POA: Diagnosis not present

## 2023-10-08 DIAGNOSIS — Z1211 Encounter for screening for malignant neoplasm of colon: Secondary | ICD-10-CM

## 2023-10-08 NOTE — Patient Instructions (Addendum)
 Ketoconazole (Nizoral) shampoo -- shampoo daily for 1-2 weeks, leave on or 5 minutes, then can reduce to 1-2 time per week once the inflammation is gone.    Health Maintenance, Male Adopting a healthy lifestyle and getting preventive care are important in promoting health and wellness. Ask your health care provider about: The right schedule for you to have regular tests and exams. Things you can do on your own to prevent diseases and keep yourself healthy. What should I know about diet, weight, and exercise? Eat a healthy diet  Eat a diet that includes plenty of vegetables, fruits, low-fat dairy products, and lean protein. Do not eat a lot of foods that are high in solid fats, added sugars, or sodium. Maintain a healthy weight Body mass index (BMI) is a measurement that can be used to identify possible weight problems. It estimates body fat based on height and weight. Your health care provider can help determine your BMI and help you achieve or maintain a healthy weight. Get regular exercise Get regular exercise. This is one of the most important things you can do for your health. Most adults should: Exercise for at least 150 minutes each week. The exercise should increase your heart rate and make you sweat (moderate-intensity exercise). Do strengthening exercises at least twice a week. This is in addition to the moderate-intensity exercise. Spend less time sitting. Even light physical activity can be beneficial. Watch cholesterol and blood lipids Have your blood tested for lipids and cholesterol at 46 years of age, then have this test every 5 years. You may need to have your cholesterol levels checked more often if: Your lipid or cholesterol levels are high. You are older than 46 years of age. You are at high risk for heart disease. What should I know about cancer screening? Many types of cancers can be detected early and may often be prevented. Depending on your health history and family  history, you may need to have cancer screening at various ages. This may include screening for: Colorectal cancer. Prostate cancer. Skin cancer. Lung cancer. What should I know about heart disease, diabetes, and high blood pressure? Blood pressure and heart disease High blood pressure causes heart disease and increases the risk of stroke. This is more likely to develop in people who have high blood pressure readings or are overweight. Talk with your health care provider about your target blood pressure readings. Have your blood pressure checked: Every 3-5 years if you are 68-52 years of age. Every year if you are 12 years old or older. If you are between the ages of 10 and 79 and are a current or former smoker, ask your health care provider if you should have a one-time screening for abdominal aortic aneurysm (AAA). Diabetes Have regular diabetes screenings. This checks your fasting blood sugar level. Have the screening done: Once every three years after age 58 if you are at a normal weight and have a low risk for diabetes. More often and at a younger age if you are overweight or have a high risk for diabetes. What should I know about preventing infection? Hepatitis B If you have a higher risk for hepatitis B, you should be screened for this virus. Talk with your health care provider to find out if you are at risk for hepatitis B infection. Hepatitis C Blood testing is recommended for: Everyone born from [birth year] through 1963. Anyone with known risk factors for hepatitis C. Sexually transmitted infections (STIs) You should be screened each  year for STIs, including gonorrhea and chlamydia, if: You are sexually active and are younger than 46 years of age. You are older than 46 years of age and your health care provider tells you that you are at risk for this type of infection. Your sexual activity has changed since you were last screened, and you are at increased risk for chlamydia or  gonorrhea. Ask your health care provider if you are at risk. Ask your health care provider about whether you are at high risk for HIV. Your health care provider may recommend a prescription medicine to help prevent HIV infection. If you choose to take medicine to prevent HIV, you should first get tested for HIV. You should then be tested every 3 months for as long as you are taking the medicine. Follow these instructions at home: Alcohol use Do not drink alcohol if your health care provider tells you not to drink. If you drink alcohol: Limit how much you have to 0-2 drinks a day. Know how much alcohol is in your drink. In the [place] one drink equals one 12 oz bottle of beer (355 mL), one 5 oz glass of wine (148 mL), or one 1 oz glass of hard liquor (44 mL). Lifestyle Do not use any products that contain nicotine or tobacco. These products include cigarettes, chewing tobacco, and vaping devices, such as e-cigarettes. If you need help quitting, ask your health care provider. Do not use street drugs. Do not share needles. Ask your health care provider for help if you need support or information about quitting drugs. General instructions Schedule regular health, dental, and eye exams. Stay current with your vaccines. Tell your health care provider if: You often feel depressed. You have ever been abused or do not feel safe at home. Summary Adopting a healthy lifestyle and getting preventive care are important in promoting health and wellness. Follow your health care provider's instructions about healthy diet, exercising, and getting tested or screened for diseases. Follow your health care provider's instructions on monitoring your cholesterol and blood pressure. This information is not intended to replace advice given to you by your health care provider. Make sure you discuss any questions you have with your health care provider. Document Revised: 08/08/2020 Document Reviewed: 08/08/2020 Elsevier  Patient Education  2024 [employer].

## 2023-10-08 NOTE — Progress Notes (Unsigned)
 Complete physical exam  Patient: Shawn Melendez   DOB: 1975-10-02   48 y.o. Male  MRN: [id number]  Subjective:    Chief Complaint  Patient presents with   Annual Exam    Shawn Melendez is a 48 y.o. male who presents today for a complete physical exam. He reports consuming a general diet. Is exercising 2 times per week, states he does feel better when he exercises. He generally feels well. He reports sleeping poorly, states he was diagnosed with OSA this year but hasn't tried using the CPAP machine, states that his father passed away this year from pancreatic cancer and he lost his job-- was able to get a a new job in March but hasn't been able to get into a good routine yet . He does not have additional problems to discuss today.   PHQ is 11 and GAD is 14-- we discussed options and patient states he does not wish to start medication at this time. Counseling provided.   Most recent fall risk assessment:    07/31/2021   11:22 AM  Fall Risk   Falls in the past year? 0  Number falls in past yr: 0  Injury with Fall? 0     Most recent depression screenings:    12/27/2022    8:16 AM 10/29/2022    2:30 PM  PHQ 2/9 Scores  PHQ - 2 Score 2 4  PHQ- 9 Score 3 15    Vision:Within last year and Dental: No current dental problems and Last dental visit: last year, needs to make an appointment.  Patient Active Problem List   Diagnosis Date Noted   Snoring 01/28/2023   Daytime somnolence 10/30/2022   Other insomnia 10/30/2022   Depression, major, single episode, moderate (HCC) 10/29/2022   Generalized anxiety disorder 10/29/2022   Positive ANA (antinuclear antibody) 11/17/2021   Numbness and tingling 06/19/2021   Acid reflux 03/02/2020      Patient Care Team: Ozell Heron HERO, MD as PCP - General (Family Medicine) Skeet Juliene SAUNDERS, DO as Consulting Physician (Neurology)   Outpatient Medications Prior to Visit  Medication Sig   [DISCONTINUED] hydrOXYzine  (VISTARIL ) 25 MG capsule TAKE  1 CAPSULE (25 MG TOTAL) BY MOUTH EVERY 8 (EIGHT) HOURS AS NEEDED.   [DISCONTINUED] omeprazole (PRILOSEC) 40 MG capsule Take 40 mg by mouth as needed. (Patient not taking: Reported on 01/28/2023)   [DISCONTINUED] sertraline  (ZOLOFT ) 50 MG tablet Take 1 tablet (50 mg total) by mouth daily.   No facility-administered medications prior to visit.    Review of Systems  HENT:  Negative for hearing loss.   Eyes:  Negative for blurred vision.  Respiratory:  Negative for shortness of breath.   Cardiovascular:  Negative for chest pain.  Gastrointestinal: Negative.   Genitourinary: Negative.   Musculoskeletal:  Negative for back pain.  Neurological:  Negative for headaches.  Psychiatric/Behavioral:  Negative for depression.        Objective:     BP 120/84   Pulse 61   Temp 98.7 F (37.1 C) (Oral)   Ht 6' 1.5 (1.867 m)   Wt 254 lb 12.8 oz (115.6 kg)   SpO2 100%   BMI 33.16 kg/m  {Vitals History (Optional):23777}  Physical Exam Vitals reviewed.  Constitutional:      Appearance: Normal appearance. He is well-groomed and overweight.  HENT:     Right Ear: Tympanic membrane and ear canal normal.     Left Ear: Tympanic membrane and ear canal normal.  Mouth/Throat:     Mouth: Mucous membranes are moist.     Pharynx: No posterior oropharyngeal erythema.  Eyes:     Extraocular Movements: Extraocular movements intact.     Conjunctiva/sclera: Conjunctivae normal.  Neck:     Thyroid: No thyromegaly.  Cardiovascular:     Rate and Rhythm: Normal rate and regular rhythm.     Heart sounds: S1 normal and S2 normal. No murmur heard. Pulmonary:     Effort: Pulmonary effort is normal.     Breath sounds: Normal breath sounds and air entry. No rales.  Abdominal:     General: Abdomen is flat. Bowel sounds are normal.  Musculoskeletal:     Right lower leg: No edema.     Left lower leg: No edema.  Lymphadenopathy:     Cervical: No cervical adenopathy.  Neurological:     General: No focal  deficit present.     Mental Status: He is alert and oriented to person, place, and time.     Gait: Gait is intact.  Psychiatric:        Mood and Affect: Mood and affect normal.      No results found for any visits on 10/08/23. {Show previous labs (optional):23779}    Assessment & Plan:    Routine Health Maintenance and Physical Exam   There is no immunization history on file for this patient.  Health Maintenance  Topic Date Due   COVID-19 Vaccine (1) Never done   DTaP/Tdap/Td (1 - Tdap) Never done   Hepatitis B Vaccines (1 of 3 - 19+ 3-dose series) Never done   Colonoscopy  Never done   Hepatitis C Screening  10/29/2023 (Originally 07/09/1995)   HIV Screening  10/29/2023 (Originally 07/08/1992)   INFLUENZA VACCINE  11/01/2023   HPV VACCINES  Aged Out   Meningococcal B Vaccine  Aged Out    Discussed health benefits of physical activity, and encouraged him to engage in regular exercise appropriate for his age and condition.  Routine general medical examination at a health care facility  Colon cancer screening -     Cologuard  Generalized anxiety disorder  Depression, major, single episode, moderate (HCC) -     CBC with Differential/Platelet; Future -     Comprehensive metabolic panel with GFR; Future -     TSH; Future  Lipid screening -     Lipid panel; Future    Return in about 1 year (around 10/07/2024) for annual physical exam.     Heron CHRISTELLA Sharper, MD

## 2023-10-09 ENCOUNTER — Ambulatory Visit
Admission: RE | Admit: 2023-10-09 | Discharge: 2023-10-09 | Disposition: A | Discharge status: Home / Self Care | Source: Ambulatory Visit | Attending: Urology | Admitting: Urology

## 2023-10-09 DIAGNOSIS — N281 Cyst of kidney, acquired: Secondary | ICD-10-CM

## 2023-10-09 DIAGNOSIS — K760 Fatty (change of) liver, not elsewhere classified: Secondary | ICD-10-CM | POA: Diagnosis not present

## 2023-10-09 LAB — TSH: TSH: 4.64 u[IU]/mL (ref 0.35–5.50)

## 2023-10-09 LAB — COMPREHENSIVE METABOLIC PANEL WITH GFR
ALT: 52 U/L (ref 0–53)
AST: 57 U/L — ABNORMAL HIGH (ref 0–37)
Albumin: 4.8 g/dL (ref 3.5–5.2)
Alkaline Phosphatase: 71 U/L (ref 39–117)
BUN: 9 mg/dL (ref 6–23)
CO2: 29 meq/L (ref 19–32)
Calcium: 9.7 mg/dL (ref 8.4–10.5)
Chloride: 98 meq/L (ref 96–112)
Creatinine, Ser: 0.94 mg/dL (ref 0.40–1.50)
GFR: 97.39 mL/min (ref >60.00)
Glucose, Bld: 77 mg/dL (ref 70–99)
Potassium: 4.3 meq/L (ref 3.5–5.1)
Sodium: 136 meq/L (ref 135–145)
Total Bilirubin: 1.3 mg/dL — ABNORMAL HIGH (ref 0.2–1.2)
Total Protein: 8.1 g/dL (ref 6.0–8.3)

## 2023-10-09 LAB — CBC WITH DIFFERENTIAL/PLATELET
Basophils Absolute: 0.1 K/uL (ref 0.0–0.1)
Basophils Relative: 1.2 % (ref 0.0–3.0)
Eosinophils Absolute: 0.2 K/uL (ref 0.0–0.7)
Eosinophils Relative: 3 % (ref 0.0–5.0)
HCT: 45.4 % (ref 39.0–52.0)
Hemoglobin: 15.4 g/dL (ref 13.0–17.0)
Lymphocytes Relative: 31.7 % (ref 12.0–46.0)
Lymphs Abs: 2.5 K/uL (ref 0.7–4.0)
MCHC: 34 g/dL (ref 30.0–36.0)
MCV: 93.8 fl (ref 78.0–100.0)
Monocytes Absolute: 0.7 K/uL (ref 0.1–1.0)
Monocytes Relative: 9 % (ref 3.0–12.0)
Neutro Abs: 4.4 K/uL (ref 1.4–7.7)
Neutrophils Relative %: 55.1 % (ref 43.0–77.0)
Platelets: 329 K/uL (ref 150.0–400.0)
RBC: 4.84 Mil/uL (ref 4.22–5.81)
RDW: 13.2 % (ref 11.5–15.5)
WBC: 8 K/uL (ref 4.0–10.5)

## 2023-10-09 LAB — LIPID PANEL
Cholesterol: 251 mg/dL — ABNORMAL HIGH (ref 0–200)
HDL: 39.4 mg/dL (ref >39.00)
LDL Cholesterol: 178 mg/dL — ABNORMAL HIGH (ref 0–99)
NonHDL: 211.29
Total CHOL/HDL Ratio: 6
Triglycerides: 168 mg/dL — ABNORMAL HIGH (ref 0.0–149.0)
VLDL: 33.6 mg/dL (ref 0.0–40.0)

## 2023-10-09 MED ORDER — GADOPICLENOL 0.5 MMOL/ML IV SOLN
10.0000 mL | Freq: Once | INTRAVENOUS | Status: AC | PRN
  Administered 2023-10-09: 10 mL via INTRAVENOUS

## 2023-10-14 ENCOUNTER — Ambulatory Visit: Payer: Self-pay | Admitting: Family Medicine

## 2023-10-21 ENCOUNTER — Ambulatory Visit: Admission: EM | Admit: 2023-10-21 | Discharge: 2023-10-21 | Disposition: A | Discharge status: Home / Self Care

## 2023-10-21 DIAGNOSIS — L282 Other prurigo: Secondary | ICD-10-CM | POA: Diagnosis not present

## 2023-10-21 MED ORDER — MUPIROCIN CALCIUM 2 % EX CREA
1.0000 | TOPICAL_CREAM | Freq: Two times a day (BID) | CUTANEOUS | 0 refills | Status: DC
Start: 2023-10-21 — End: 2023-11-14

## 2023-10-21 NOTE — ED Triage Notes (Addendum)
 Pt present with a rash below the eyebrows and itchy, flaky skin x two weeks. Pt states he has used Cetaphil for relief. The rash has spread to the back of the ears.

## 2023-10-21 NOTE — Progress Notes (Signed)
 I would recommend ketoconazole 1% cream for the itchy skin behind his ears-- it's usually a fungal problem rather than bacterial-- he can obtain this cream over the counter at the pharmacy

## 2023-10-21 NOTE — Discharge Instructions (Signed)
 We have sent in the antibiotic cream that covers for impetigo which may be what the drainage and crusting behind the ear is.  Additionally, you can try hydrocortisone cream underneath the eyebrows to help with the rash and itching there.  We do recommend following up with your primary care provider to ensure that symptoms are improving.  Please return or go to the emergency department if you develop any fever, significant swelling of the eyes or eyelids, severe pain with eye movement, pain out of proportion, or if you have any other concerns.

## 2023-10-21 NOTE — ED Provider Notes (Signed)
 [facility]    CSN: [id number] Arrival date & time: 10/21/23  1001      History   Chief Complaint Chief Complaint  Patient presents with   Rash    HPI Shawn Melendez is a 48 y.o. male.   HPI Patient is a 48 year old male who presents to the urgent care today with concerns of a rash underneath his eyebrows and behind his ears.  He reports that the rash underneath the eyebrows is itchy.  He has been putting moisturizer on that area with no improvement in his symptoms.  He denies any changes in vision.  He reports having a pimple behind his ear about a month ago and was scratching it and since then, thinks that the symptoms have been getting worse.  He has noticed a little bit of drainage and crusting behind the ear.  He saw his primary care doctor who recommended a shampoo but he never picked it up or tried it.  He denies any other symptoms including fever, vomiting, neurologic changes, pain out of proportion, swelling of the eyelids, pain with eye movement, or other concerns at this time. Past Medical History:  Diagnosis Date   Anxiety    Blood in stool    Depression    GERD (gastroesophageal reflux disease)    Wears contact lenses     Patient Active Problem List   Diagnosis Date Noted   Snoring 01/28/2023   Daytime somnolence 10/30/2022   Other insomnia 10/30/2022   Depression, major, single episode, moderate (HCC) 10/29/2022   Generalized anxiety disorder 10/29/2022   Positive ANA (antinuclear antibody) 11/17/2021   Numbness and tingling 06/19/2021   Acid reflux 03/02/2020    Past Surgical History:  Procedure Laterality Date   ESOPHAGOGASTRODUODENOSCOPY (EGD) WITH PROPOFOL  N/A 06/27/2021   Procedure: ESOPHAGOGASTRODUODENOSCOPY (EGD) WITH PROPOFOL ;  Surgeon: Unk Corinn Skiff, MD;  Location: [facility];  Service: Endoscopy;  Laterality: N/A;   ROTATOR CUFF REPAIR Right    2000   SHOULDER ARTHROSCOPY Right        Home Medications    Prior to  Admission medications   Medication Sig Start Date End Date Taking? Authorizing Provider  mupirocin  cream (BACTROBAN ) 2 % Apply 1 Application topically 2 (two) times daily. 10/21/23  Yes Melonie Locus, PA-C    Family History Family History  Problem Relation Age of Onset   Hypertension Mother    Heart disease Father    Depression Father    Pancreatic cancer Father    Diabetes Father    Hypertension Father    Stroke Father    Multiple sclerosis Father    Hypertension Brother    Hyperlipidemia Brother    Diabetes Brother    Healthy Daughter    Healthy Daughter    Healthy Son    Healthy Son     Social History Social History   Tobacco Use   Smoking status: Never   Smokeless tobacco: Never  Vaping Use   Vaping status: Never Used  Substance Use Topics   Alcohol use: Yes    Alcohol/week: 10.0 standard drinks of alcohol    Types: 10 Shots of liquor per week    Comment: 3-4 times a week   Drug use: Never     Allergies   Patient has no known allergies.   Review of Systems Review of Systems See HPI for relevant ROS.  Physical Exam Triage Vital Signs ED Triage Vitals  Encounter Vitals Group     BP 10/21/23 1034 ROLLEN)  146/97     Girls Systolic BP Percentile --      Girls Diastolic BP Percentile --      Boys Systolic BP Percentile --      Boys Diastolic BP Percentile --      Pulse Rate 10/21/23 1034 65     Resp 10/21/23 1034 16     Temp 10/21/23 1034 98.6 F (37 C)     Temp Source 10/21/23 1034 Oral     SpO2 10/21/23 1034 98 %     Weight --      Height --      Head Circumference --      Peak Flow --      Pain Score 10/21/23 1033 0     Pain Loc --      Pain Education --      Exclude from Growth Chart --    No data found.  Updated Vital Signs BP (!) 146/97 (BP Location: Right Arm)   Pulse 65   Temp 98.6 F (37 C) (Oral)   Resp 16   SpO2 98%   Visual Acuity Right Eye Distance:   Left Eye Distance:   Bilateral Distance:    Right Eye Near:   Left  Eye Near:    Bilateral Near:     Physical Exam General: Alert and oriented, well-developed/well-nourished, calm, cooperative, no acute distress HEENT: Normocephalic atraumatic, moist mucous membranes, no scleral icterus, trachea midline, mild scaling without erythema underneath the eyebrows, golden crusting behind the right ear Lungs: Speaking full sentences, non-labored respirations, no distress Heart: Regular rate and rhythm Abdomen:  Soft, nondistended Musculoskeletal: Moves all extremities well Neurologic: Awake, A&O x4, gait normal Integumentary: Warm, dry, normal for ethnicity, intact, no rash Psychiatric: Appropriate mood & affect  UC Treatments / Results  Labs (all labs ordered are listed, but only abnormal results are displayed) Labs Reviewed - No data to display  EKG   Radiology No results found.  Procedures Procedures (including critical care time)  Medications Ordered in UC Medications - No data to display  Initial Impression / Assessment and Plan / UC Course  I have reviewed the triage vital signs and the nursing notes.  Pertinent labs & imaging results that were available during my care of the patient were reviewed by me and considered in my medical decision making (see chart for details).    Presents with rash underneath the eyebrows and behind the ear.  Differential Diagnosis: Impetigo, blepharitis, contact dermatitis, atopic dermatitis, tinea, drug-induced reaction, urticaria, burn, folliculitis, herpes zoster, including other diagnoses.  Rationale: History and exam findings not consistent with dangerous etiologies of rash such as SJS/TEN, or secondary dangerous causes such as petechial rashes from thrombocytopenia or rickettsial infections. Rash does not appear urticarial with no signs of anaphylaxis either.  Prescribed patient mupirocin  cream for coverage of possible impetigo due to the golden crusting behind the right ear.  Patient should follow-up with  their PCP in the next several days as needed.  Discussed over-the-counter options patient can use for symptomatic improvement.  Discussed return precautions to the urgent care or emergency department including worsening of symptoms, fevers, pain out of proportion, systemic symptoms, changes in vision, pain with extraocular movement, significant swelling of the eyelids or eyes, or if they have any other concerns.  Disposition: Stable to discharge home.  All questions answered to the best of this examiner's ability. Reviewed possible severe sequelae and other reasons to return to urgent care or ED for further evaluation  and/or treatment. Advised to f/u PCP w/in 48 to 72 hours for further eval and/or reassessment. Patient voices understanding of the above and agrees to plan.  An appropriate evaluation has been performed, and in my medical judgment there is currently no evidence of an immediate life-threatening or surgical condition. Discharge is therefore indicated at this time.  This document was created using the aid of voice recognition Scientist, clinical (histocompatibility and immunogenetics).  Final Clinical Impressions(s) / UC Diagnoses   Final diagnoses:  Pruritic rash     Discharge Instructions      We have sent in the antibiotic cream that covers for impetigo which may be what the drainage and crusting behind the ear is.  Additionally, you can try hydrocortisone cream underneath the eyebrows to help with the rash and itching there.  We do recommend following up with your primary care provider to ensure that symptoms are improving.  Please return or go to the emergency department if you develop any fever, significant swelling of the eyes or eyelids, severe pain with eye movement, pain out of proportion, or if you have any other concerns.    ED Prescriptions     Medication Sig Dispense Auth. Provider   mupirocin  cream (BACTROBAN ) 2 % Apply 1 Application topically 2 (two) times daily. 15 g Melonie Locus, PA-C       PDMP not reviewed this encounter.   Melonie Locus, PA-C 10/21/23 1128

## 2023-10-22 ENCOUNTER — Ambulatory Visit

## 2023-11-12 DIAGNOSIS — N281 Cyst of kidney, acquired: Secondary | ICD-10-CM | POA: Diagnosis not present

## 2023-11-12 DIAGNOSIS — N200 Calculus of kidney: Secondary | ICD-10-CM | POA: Diagnosis not present

## 2023-11-14 ENCOUNTER — Ambulatory Visit (INDEPENDENT_AMBULATORY_CARE_PROVIDER_SITE_OTHER): Admitting: Family Medicine

## 2023-11-14 ENCOUNTER — Encounter: Payer: Self-pay | Admitting: Family Medicine

## 2023-11-14 VITALS — BP 118/82 | HR 67 | Temp 98.9°F | Ht 73.5 in | Wt 255.1 lb

## 2023-11-14 DIAGNOSIS — L219 Seborrheic dermatitis, unspecified: Secondary | ICD-10-CM | POA: Diagnosis not present

## 2023-11-14 MED ORDER — KETOCONAZOLE 2 % EX CREA: 1.0000 | TOPICAL_CREAM | Freq: Two times a day (BID) | CUTANEOUS | 2 refills | Status: AC

## 2023-11-14 NOTE — Progress Notes (Signed)
   Established Patient Office Visit  Subjective   Patient ID: Shawn Melendez, male    DOB: Mar 06, 1976  Age: 48 y.o. MRN: [id number]  Chief Complaint  Patient presents with   Itchy skin bilateral ears and eyebrows x2 months+    States that he has had problems with itchy skin and flaky skin around the hairline, earsand eyebrows for about the last 2 months, states he has tried the OTC head and shoulders which helps a little but it is not resolving his symptoms.     Current Outpatient Medications  Medication Instructions   ketoconazole  (NIZORAL ) 2 % cream 1 Application, Topical, 2 times daily    Patient Active Problem List   Diagnosis Date Noted   Snoring 01/28/2023   Daytime somnolence 10/30/2022   Other insomnia 10/30/2022   Depression, major, single episode, moderate (HCC) 10/29/2022   Generalized anxiety disorder 10/29/2022   Positive ANA (antinuclear antibody) 11/17/2021   Numbness and tingling 06/19/2021   Acid reflux 03/02/2020      Review of Systems  All other systems reviewed and are negative.     Objective:     BP 118/82   Pulse 67   Temp 98.9 F (37.2 C) (Oral)   Ht 6' 1.5 (1.867 m)   Wt 255 lb 1.6 oz (115.7 kg)   SpO2 96%   BMI 33.20 kg/m    Physical Exam Constitutional:      Appearance: Normal appearance. He is normal weight.  Pulmonary:     Effort: Pulmonary effort is normal.  Skin:    Findings: Rash (diffuse red flaky rash around the ears, backs of ears, back of scalp,front hairline and also the eyebrows, no discrete lesions) present.  Neurological:     Mental Status: He is alert.      No results found for any visits on 11/14/23.    The 10-year ASCVD risk score (Arnett DK, et al., 2019) is: 3.8%    Assessment & Plan:  Seborrheic dermatitis -     Ketoconazole ; Apply 1 Application topically 2 (two) times daily.  Dispense: 60 g; Refill: 2   Severe seborrheic dermatitis, I recommended starting ketoconazole  2% cream plus using selenium  sulfide or nizoral  shampoo. Pt was given written instructions on where to find the shampoo OTC. RTC PRN  No follow-ups on file.    Heron CHRISTELLA Sharper, MD

## 2023-11-14 NOTE — Patient Instructions (Signed)
 Selenium sulfide shampoo-- every day for 7-14 days, then go down to once a week

## 2023-12-03 DIAGNOSIS — L814 Other melanin hyperpigmentation: Secondary | ICD-10-CM | POA: Diagnosis not present

## 2023-12-03 DIAGNOSIS — D485 Neoplasm of uncertain behavior of skin: Secondary | ICD-10-CM | POA: Diagnosis not present

## 2023-12-03 DIAGNOSIS — L821 Other seborrheic keratosis: Secondary | ICD-10-CM | POA: Diagnosis not present

## 2023-12-03 DIAGNOSIS — D229 Melanocytic nevi, unspecified: Secondary | ICD-10-CM | POA: Diagnosis not present

## 2023-12-03 DIAGNOSIS — L578 Other skin changes due to chronic exposure to nonionizing radiation: Secondary | ICD-10-CM | POA: Diagnosis not present

## 2023-12-03 DIAGNOSIS — D225 Melanocytic nevi of trunk: Secondary | ICD-10-CM | POA: Diagnosis not present
